# Patient Record
Sex: Female | Born: 1980
Health system: Southern US, Community
[De-identification: ages and names within clinical notes are randomized; demographics above are authoritative.]

## PROBLEM LIST (undated history)

## (undated) DIAGNOSIS — I10 Essential (primary) hypertension: Secondary | ICD-10-CM

## (undated) DIAGNOSIS — F329 Major depressive disorder, single episode, unspecified: Secondary | ICD-10-CM

## (undated) DIAGNOSIS — F32A Depression, unspecified: Secondary | ICD-10-CM

## (undated) DIAGNOSIS — R000 Tachycardia, unspecified: Secondary | ICD-10-CM

## (undated) HISTORY — DX: Depression, unspecified: F32.A

## (undated) HISTORY — DX: Essential (primary) hypertension: I10

## (undated) HISTORY — DX: Major depressive disorder, single episode, unspecified: F32.9

## (undated) HISTORY — DX: Tachycardia, unspecified: R00.0

---

## 2003-04-06 ENCOUNTER — Inpatient Hospital Stay (HOSPITAL_COMMUNITY): Admission: AD | Admit: 2003-04-06 | Discharge: 2003-04-12 | Payer: Self-pay | Admitting: Obstetrics & Gynecology

## 2003-04-07 ENCOUNTER — Encounter: Payer: Self-pay | Admitting: Obstetrics and Gynecology

## 2003-04-09 ENCOUNTER — Encounter: Payer: Self-pay | Admitting: Obstetrics and Gynecology

## 2003-04-18 ENCOUNTER — Ambulatory Visit (HOSPITAL_COMMUNITY): Admission: AD | Admit: 2003-04-18 | Discharge: 2003-04-18 | Payer: Self-pay | Admitting: Obstetrics & Gynecology

## 2003-04-20 ENCOUNTER — Ambulatory Visit (HOSPITAL_COMMUNITY): Admission: AD | Admit: 2003-04-20 | Discharge: 2003-04-20 | Payer: Self-pay | Admitting: Obstetrics & Gynecology

## 2003-04-24 ENCOUNTER — Ambulatory Visit (HOSPITAL_COMMUNITY): Admission: AD | Admit: 2003-04-24 | Discharge: 2003-04-24 | Payer: Self-pay | Admitting: Obstetrics & Gynecology

## 2003-04-27 ENCOUNTER — Ambulatory Visit (HOSPITAL_COMMUNITY): Admission: AD | Admit: 2003-04-27 | Discharge: 2003-04-27 | Payer: Self-pay | Admitting: Obstetrics & Gynecology

## 2003-05-01 ENCOUNTER — Ambulatory Visit (HOSPITAL_COMMUNITY): Admission: AD | Admit: 2003-05-01 | Discharge: 2003-05-01 | Payer: Self-pay | Admitting: Obstetrics & Gynecology

## 2003-05-04 ENCOUNTER — Ambulatory Visit (HOSPITAL_COMMUNITY): Admission: AD | Admit: 2003-05-04 | Discharge: 2003-05-04 | Payer: Self-pay | Admitting: Obstetrics & Gynecology

## 2003-05-08 ENCOUNTER — Inpatient Hospital Stay (HOSPITAL_COMMUNITY): Admission: AD | Admit: 2003-05-08 | Discharge: 2003-05-11 | Payer: Self-pay | Admitting: Obstetrics & Gynecology

## 2003-09-28 ENCOUNTER — Other Ambulatory Visit: Admission: RE | Admit: 2003-09-28 | Discharge: 2003-09-28 | Payer: Self-pay | Admitting: Advanced Practice Midwife

## 2005-09-25 ENCOUNTER — Ambulatory Visit: Payer: Self-pay | Admitting: Family Medicine

## 2006-02-02 ENCOUNTER — Ambulatory Visit: Payer: Self-pay | Admitting: Family Medicine

## 2006-11-29 ENCOUNTER — Ambulatory Visit: Payer: Self-pay | Admitting: Family Medicine

## 2006-11-29 DIAGNOSIS — I10 Essential (primary) hypertension: Secondary | ICD-10-CM

## 2006-11-29 DIAGNOSIS — J309 Allergic rhinitis, unspecified: Secondary | ICD-10-CM | POA: Insufficient documentation

## 2006-12-27 ENCOUNTER — Ambulatory Visit: Payer: Self-pay | Admitting: Family Medicine

## 2006-12-27 DIAGNOSIS — M79609 Pain in unspecified limb: Secondary | ICD-10-CM

## 2006-12-28 ENCOUNTER — Telehealth (INDEPENDENT_AMBULATORY_CARE_PROVIDER_SITE_OTHER): Payer: Self-pay | Admitting: Family Medicine

## 2006-12-31 ENCOUNTER — Encounter (INDEPENDENT_AMBULATORY_CARE_PROVIDER_SITE_OTHER): Payer: Self-pay | Admitting: Family Medicine

## 2007-01-03 ENCOUNTER — Ambulatory Visit (HOSPITAL_COMMUNITY): Admission: RE | Admit: 2007-01-03 | Discharge: 2007-01-03 | Payer: Self-pay | Admitting: Family Medicine

## 2007-01-03 LAB — CONVERTED CEMR LAB
BUN: 11 mg/dL (ref 6–23)
CO2: 23 meq/L (ref 19–32)
Chloride: 103 meq/L (ref 96–112)
Cholesterol: 195 mg/dL (ref 0–200)
Creatinine, Ser: 0.81 mg/dL (ref 0.40–1.50)
Eosinophils Absolute: 0.2 10*3/uL (ref 0.0–0.7)
Glucose, Bld: 78 mg/dL (ref 70–99)
HCT: 46.7 % (ref 39.0–52.0)
HDL: 49 mg/dL (ref >39)
LDL Cholesterol: 126 mg/dL — ABNORMAL HIGH (ref 0–99)
Lymphocytes Relative: 25 % (ref 12–46)
Lymphs Abs: 1.2 10*3/uL (ref 0.7–3.3)
MCV: 85.1 fL (ref 78.0–100.0)
Monocytes Relative: 9 % (ref 3–11)
Neutro Abs: 2.8 10*3/uL (ref 1.7–7.7)
Neutrophils Relative %: 60 % (ref 43–77)
Potassium: 4 meq/L (ref 3.5–5.3)
Sodium: 140 meq/L (ref 135–145)
Total Bilirubin: 0.7 mg/dL (ref 0.3–1.2)
Total CHOL/HDL Ratio: 4
Triglycerides: 102 mg/dL (ref <150)
VLDL: 20 mg/dL (ref 0–40)
WBC: 4.7 10*3/uL (ref 4.0–10.5)

## 2007-01-10 ENCOUNTER — Ambulatory Visit: Payer: Self-pay | Admitting: Family Medicine

## 2007-01-10 LAB — CONVERTED CEMR LAB: HDL goal, serum: 40 mg/dL

## 2007-02-25 ENCOUNTER — Ambulatory Visit: Payer: Self-pay | Admitting: Family Medicine

## 2007-02-25 DIAGNOSIS — F438 Other reactions to severe stress: Secondary | ICD-10-CM

## 2007-02-26 ENCOUNTER — Encounter (INDEPENDENT_AMBULATORY_CARE_PROVIDER_SITE_OTHER): Payer: Self-pay | Admitting: Family Medicine

## 2007-03-01 LAB — CONVERTED CEMR LAB
BUN: 10 mg/dL (ref 6–23)
CO2: 23 meq/L (ref 19–32)
Glucose, Bld: 81 mg/dL (ref 70–99)

## 2007-03-10 ENCOUNTER — Encounter (INDEPENDENT_AMBULATORY_CARE_PROVIDER_SITE_OTHER): Payer: Self-pay | Admitting: Family Medicine

## 2007-04-27 ENCOUNTER — Ambulatory Visit: Payer: Self-pay | Admitting: Family Medicine

## 2007-04-27 ENCOUNTER — Encounter (INDEPENDENT_AMBULATORY_CARE_PROVIDER_SITE_OTHER): Payer: Self-pay | Admitting: Family Medicine

## 2007-04-27 ENCOUNTER — Encounter: Payer: Self-pay | Admitting: Family Medicine

## 2007-04-27 LAB — CONVERTED CEMR LAB: Pap Smear: NORMAL

## 2007-04-28 ENCOUNTER — Encounter (INDEPENDENT_AMBULATORY_CARE_PROVIDER_SITE_OTHER): Payer: Self-pay | Admitting: Family Medicine

## 2007-07-29 ENCOUNTER — Ambulatory Visit: Payer: Self-pay | Admitting: Family Medicine

## 2007-07-29 DIAGNOSIS — G5600 Carpal tunnel syndrome, unspecified upper limb: Secondary | ICD-10-CM

## 2007-07-30 ENCOUNTER — Encounter (INDEPENDENT_AMBULATORY_CARE_PROVIDER_SITE_OTHER): Payer: Self-pay | Admitting: Family Medicine

## 2007-07-30 LAB — CONVERTED CEMR LAB
CO2: 23 meq/L (ref 19–32)
Calcium: 9.3 mg/dL (ref 8.4–10.5)
Creatinine, Ser: 0.73 mg/dL (ref 0.40–1.20)
Glucose, Bld: 96 mg/dL (ref 70–99)
Sodium: 138 meq/L (ref 135–145)

## 2007-08-01 ENCOUNTER — Telehealth (INDEPENDENT_AMBULATORY_CARE_PROVIDER_SITE_OTHER): Payer: Self-pay | Admitting: *Deleted

## 2007-08-17 ENCOUNTER — Encounter (INDEPENDENT_AMBULATORY_CARE_PROVIDER_SITE_OTHER): Payer: Self-pay | Admitting: Family Medicine

## 2007-09-30 ENCOUNTER — Ambulatory Visit: Payer: Self-pay | Admitting: Family Medicine

## 2008-05-31 ENCOUNTER — Inpatient Hospital Stay (HOSPITAL_COMMUNITY): Admission: RE | Admit: 2008-05-31 | Discharge: 2008-06-02 | Payer: Self-pay | Admitting: Obstetrics and Gynecology

## 2008-07-09 IMAGING — US US ABDOMEN COMPLETE
1 series · 14 of 25 positions shown · non-contrast
Comparison: none

CLINICAL DATA: Epigastric pain.
 ABDOMEN ULTRASOUND:
TECHNIQUE: Complete abdominal ultrasound examination was performed including evaluation of the liver, gallbladder, bile ducts, pancreas, kidneys, spleen, IVC, and abdominal aorta.
 No comparison.

[Series 1: unknown · 0.33mm/px · 14 of 55 slices shown]
[im 1/55]
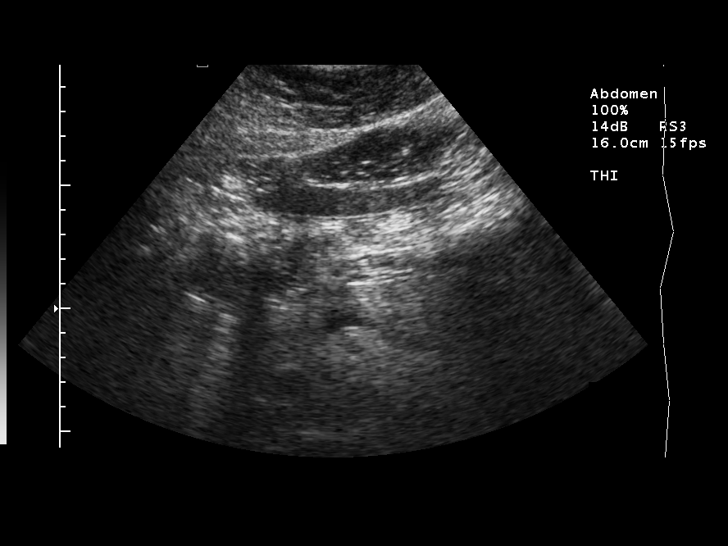
[im 5/55]
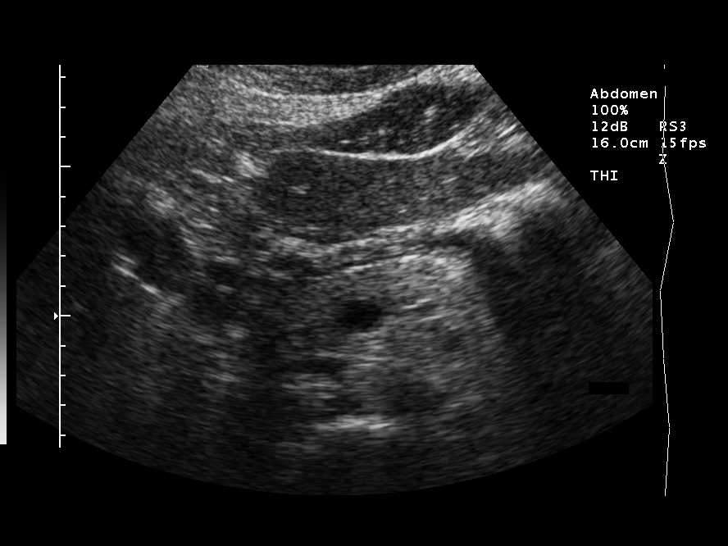
[im 10/55]
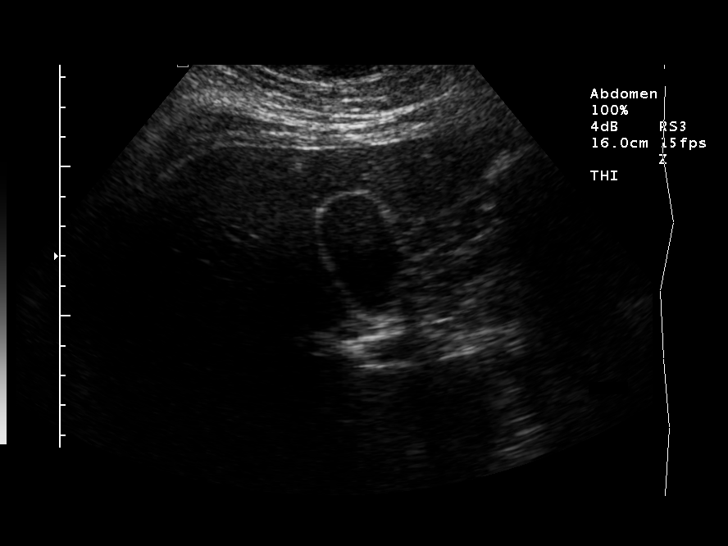
[im 14/55]
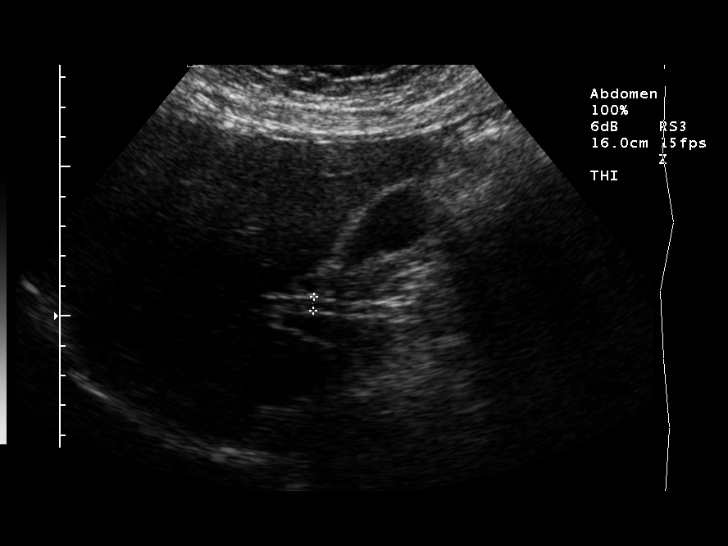
[im 19/55]
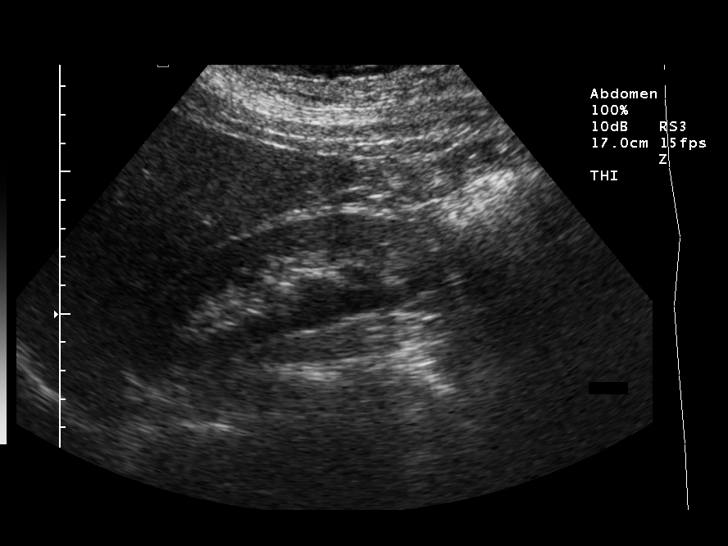
[im 21/55]
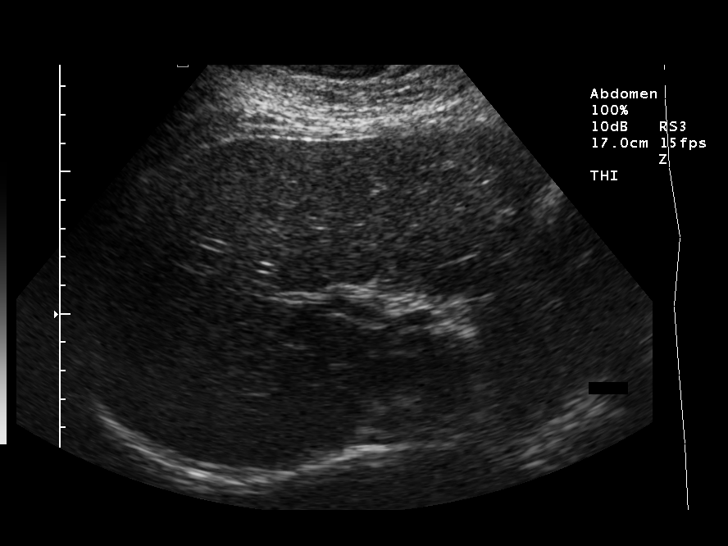
[im 25/55]
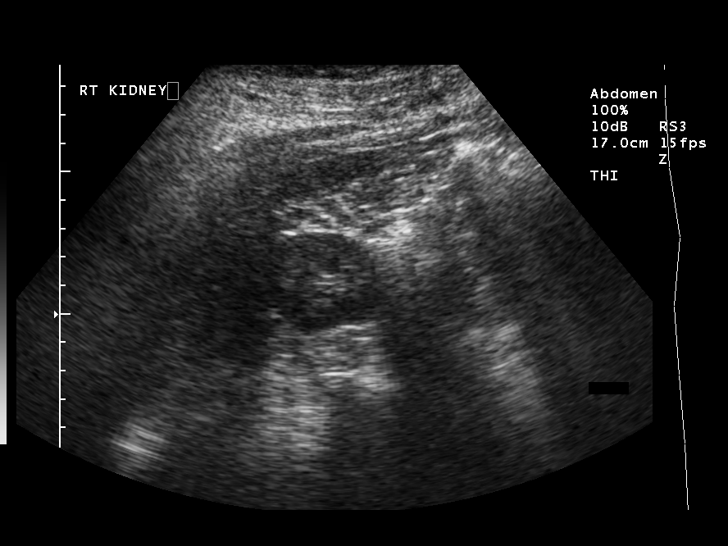
[im 30/55]
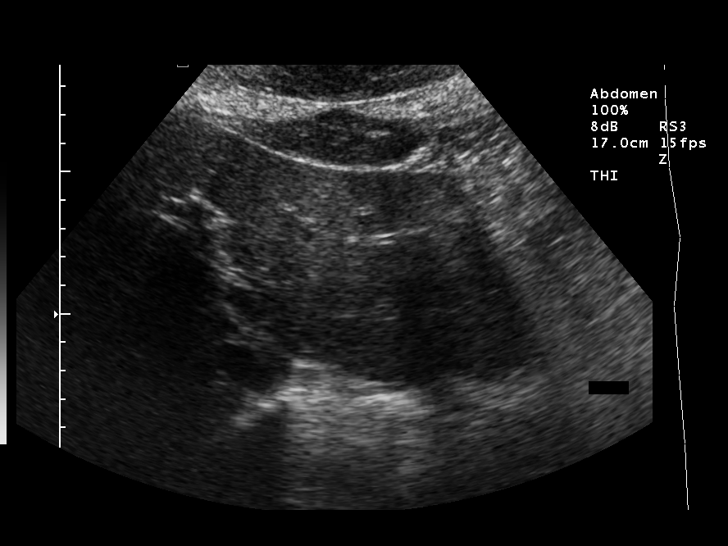
[im 34/55]
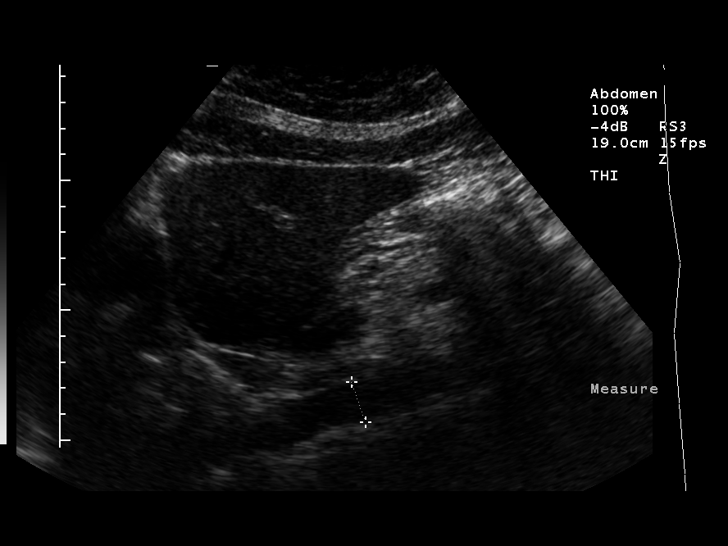
[im 37/55]
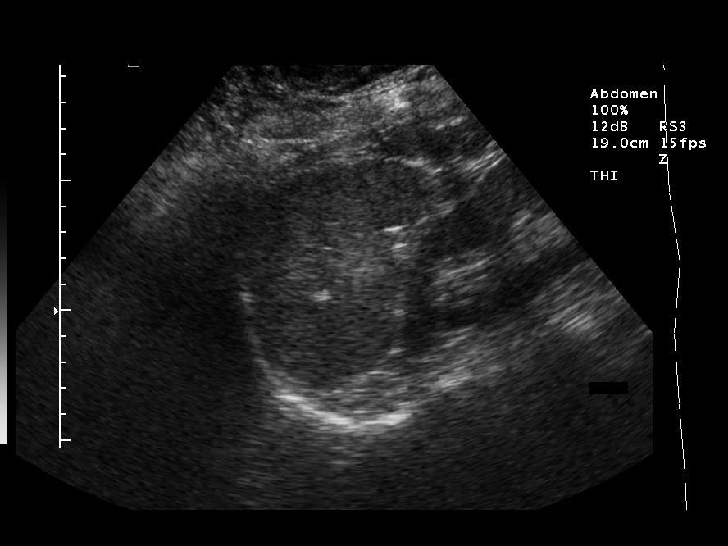
[im 41/55]
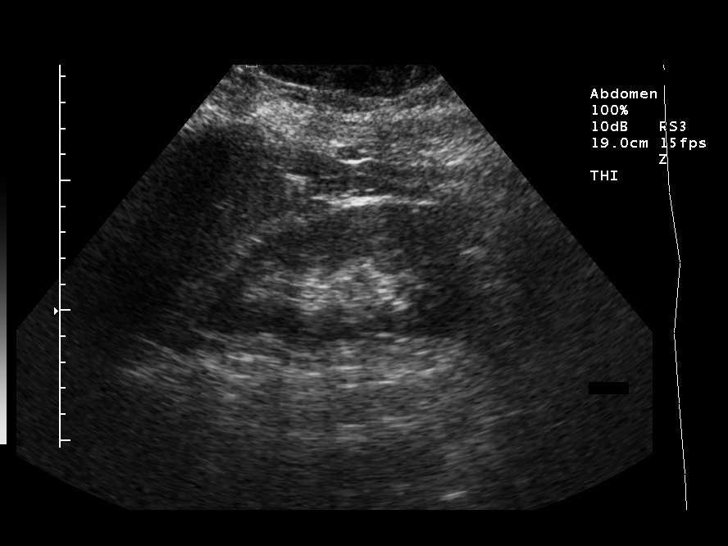
[im 46/55]
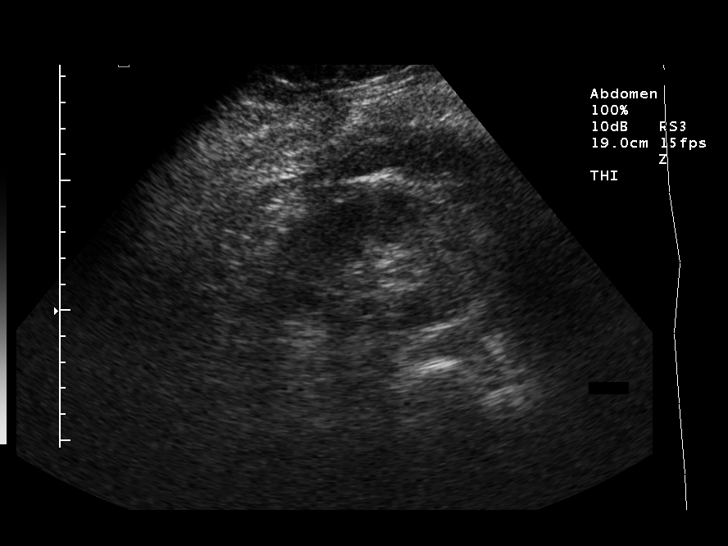
[im 50/55]
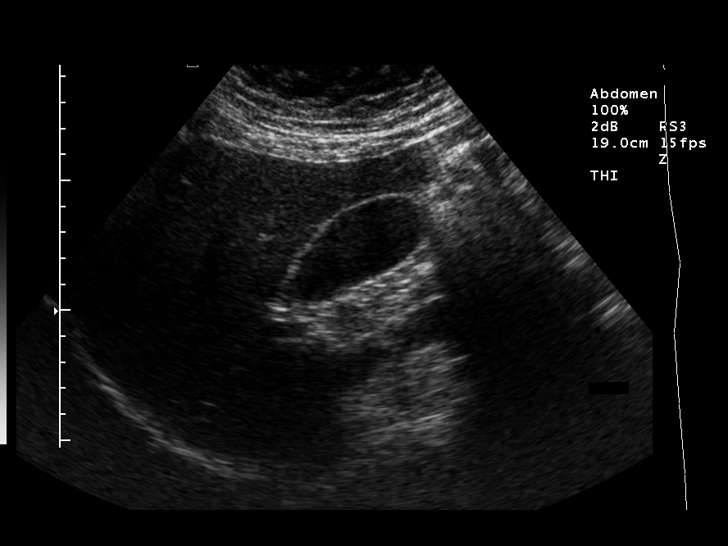
[im 55/55]
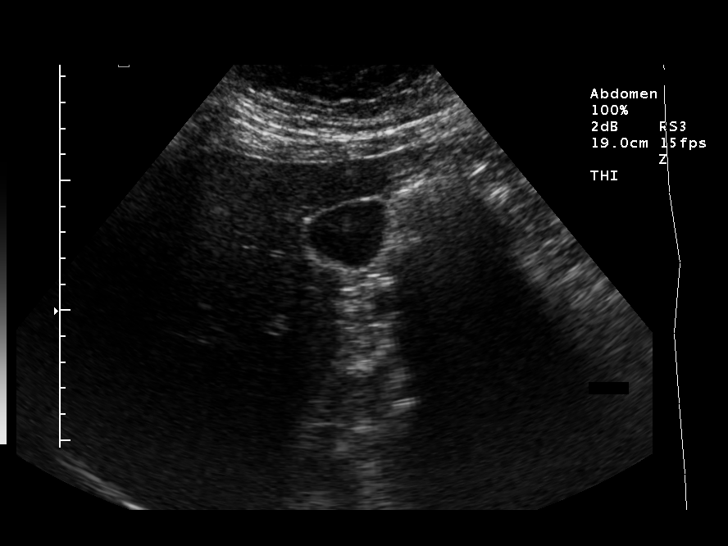

[14 of 25 positions shown; findings below may reference images not displayed]

FINDINGS: An echogenic focus in the nondependent wall of the gallbladder is present which does not shadow.  It may be a polyp but does not appear to be a stone.  The gallbladder wall is not thickened measuring 2.2 mm. The common bile duct is 4.7 mm.  The liver, IVC, pancreas, spleen, kidneys, and aorta are normal.
IMPRESSION: Echogenic focus in the nondependent wall of the gallbladder does not appear to represent a stone and may be a small polyp.  No acute abnormality.

## 2011-01-20 NOTE — Discharge Summary (Signed)
NAME:  HOPPERHarold, Mattes                ACCOUNT NO.:  [id number]   MEDICAL RECORD NO.:  [id number]          PATIENT TYPE:  INP   LOCATION:  9111                          FACILITY:  [facility]   PHYSICIAN:  Gerrit Friends. Aldona Bar, M.D.   DATE OF BIRTH:  08-Jul-1981   DATE OF ADMISSION:  05/31/2008  DATE OF DISCHARGE:  06/02/2008                               DISCHARGE SUMMARY   DISCHARGE DIAGNOSES:  1. A 41-week intrauterine pregnancy, delivered 7 pounds 15 ounces      female infant, Apgars 9 and 9.  2. Blood type O+.  3. Induction of labor.   PROCEDURES:  Normal spontaneous delivery, first-degree tears of repair.   SUMMARY:  This 30 year old gravida 2 now para 2 was admitted to 41 weeks  plus for induction.  She had an uneventful course of her labor with  subsequent normal spontaneous delivery of a viable 7 pounds 15 ounces  female infant with Apgars of 9 and 9 and first-degree tears were repair  without difficulty.  Her postpartum hemoglobin was 9.8 with a white  count of 95,000, and a platelet count of 219,000.  On the morning of  September 26, she was ambulating well, tolerating a regular diet well,  was bottle feeding without difficulty.   PHYSICAL EXAMINATION:  VITAL SIGNS:  Stable.  She was desirous of  discharge.  Accordingly, she was given all appropriate instructions and  understood all instructions well.   DISCHARGE MEDICATIONS:  1. Feosol capsules - 1 daily, until she returns to the office in 4      weeks' time.  2. Tylox 1-2 in 4-6 hours as needed for severe discomfort.  3. Motrin 600 mg every 6 hours as needed for cramping or pain.   As mentioned, she was given written discharge instructions and  understood them well.   CONDITION ON DISCHARGE:  Improved.      Gerrit Friends. Aldona Bar, M.D.  Electronically Signed     RMW/MEDQ  D:  06/02/2008  T:  06/02/2008  Job:  [id number]

## 2011-01-23 NOTE — Op Note (Signed)
   NAME:  BRINLYN, CENA                          ACCOUNT NO.:  [id number]   MEDICAL RECORD NO.:  [id number]                   PATIENT TYPE:  INP   LOCATION:  LDR2                                 FACILITY:  [facility]   PHYSICIAN:  Lazaro Arms, M.D.                DATE OF BIRTH:  1980/09/18   DATE OF PROCEDURE:  05/09/2003  DATE OF DISCHARGE:                                  PROCEDURE NOTE   PROCEDURE:  Epidural placement.   Asher Muir is a 30 year old white female, gravida 1, para 0, being induced at 39  weeks because of a history of a marginal sinus abruption.  She has  progressed nicely through labor.  She is 6 cm, had two doses of IV pain  medicine, and is requesting an epidural.  The fetal heart rate strip is  reassuring.   The patient is placed in sitting position, the iliac crest, the L2-3  interspace identified.  The area is prepped and draped, 1% injected as a  local anesthetic.  A 17-gauge Tuohy needle is used and a loss of resistance  technique employed with one pass.  The epidural space is found, 10 mL of  0.125% bupivacaine is placed as a test dose without ill effects.  Additional  dosing was performed, 0.125% bupivacaine and 5 mL of 1.5% lidocaine with  1:200,000 epinephrine.  The continuous pump was then hooked up.  It was  placed 5 cm into the epidural space and taped at 12 cm at the skin.  The  patient was comfortable several minutes after it was placed with no ill  effects.                                               Lazaro Arms, M.D.    Loraine Maple  D:  05/09/2003  T:  05/09/2003  Job:  [id number]

## 2011-01-23 NOTE — H&P (Signed)
NAME:  Jacqueline Lowery, Jacqueline Lowery                        ACCOUNT NO.:  [id number]   MEDICAL RECORD NO.:  [id number]                   PATIENT TYPE:  INP   LOCATION:  LDR2                                 FACILITY:  [facility]   PHYSICIAN:  Tilda Burrow, M.D.              DATE OF BIRTH:  19-Feb-1981   DATE OF ADMISSION:  04/06/2003  DATE OF DISCHARGE:                                HISTORY & PHYSICAL   ADMITTING DIAGNOSES:  1. Heavy vaginal bleeding.  2. Pregnancy 34 weeks 2 days to 35 weeks 3 days.  3. Anterior marginal placenta.  4. Marginal sinus separation with bleeding.   HISTORY OF PRESENT ILLNESS:  This is a 30 year old female, gravida 1, para  0, limited prenatal care, last menstrual period August 07, 2002, placing  menstrual [facility] May 17, 2003.  This would make her 34 weeks 2 days.  Ultrasound May 09, 2003, suggested that she is now 35 weeks 3 days  (plus/minus three weeks).  She is admitted with spontaneous heavy bleeding  beginning at 6 p.m. at home, one hour after mild discomfort was first noted.  She has had no rupture of membranes.  EMS transported to [facility]  and shows generous vaginal bleeding and external monitoring shows healthy  baby with reactive fetal heart rate tracing but shows mild uterine  contractions on a regular basis spaced out at a 3- to 4-minute interval,  with good relaxation between contractions.  Ultrasound shows anterior  placenta close to the os but not completely reaching the os with the  placenta still ballotable and engaged with the lower uterine segment.  There  is no retroplacental bleeding noted in the anterior placenta.  The brief  ultrasound done by __________.  Limited ultrasound was performed.   PAST MEDICAL HISTORY:  Benign.   SURGICAL HISTORY:  Negative.   ALLERGIES:  None.   PHYSICAL EXAMINATION:  GENERAL:  Healthy-appearing and severely anemic  Caucasian female.  The prenatal record shows that she was anemic with  hemoglobin 8.5, hematocrit 29.5 with her initial visit one month ago.  ABDOMEN:  Nontender.  Gravid uterus, 33 cm at last check, with ultrasound  showing vertex presentation with anterior placenta below the vertex.  GENITOURINARY:  Cervical exam after completion of ultrasound shows clots in  the os which is less than 1 cm dilated and 50% effaced.  Ultrasound done  during the exam shows the uterus approximately a couple of centimeters from  the internal os to the cervical edge.   IMPRESSION:  Pregnancy at 34+ weeks gestation, anterior low-lying placenta  with marginal sinus separation.    PLAN:  Admit.  Tocolytics to reduce uterine irritability.  Antibiotics until  group B strep has returned.  Will keep Foley catheter in place.  Transfusion  of two units of packed cells given due to low hematocrit which returned at  7.7 and 24.8 after 2 L of fluids vigorously  administered.   ADDENDUM:  The patient's posttransfusion hemoglobin is 8.6, hematocrit 27.3  on April 07, 2003.                                               Tilda Burrow, M.D.    JVF/MEDQ  D:  04/08/2003  T:  04/08/2003  Job:  [id number]

## 2011-01-23 NOTE — Op Note (Signed)
NAME:  Jacqueline Lowery, Jacqueline Lowery                          ACCOUNT NO.:  [id number]   MEDICAL RECORD NO.:  [id number]                   PATIENT TYPE:  INP   LOCATION:  LDR2                                 FACILITY:  [facility]   PHYSICIAN:  Lazaro Arms, M.D.                DATE OF BIRTH:  Jun 13, 1981   DATE OF PROCEDURE:  05/09/2003  DATE OF DISCHARGE:                                  PROCEDURE NOTE   DELIVERY SUMMARY:  Onset of labor May 09, 2003, delivery May 09, 2003 at 1522.   Length of first stage labor 5 hours, 30 minutes.  Length of second stage  labor 2 hours, 22 minutes. Length of third stage labor 18 minutes.   DELIVERY NOTE:  Song had a vacuum assisted delivery under epidural  anesthesia due to variable decelerations that were prolonged.  The patient  was assisted x2 uterine contractions with a Kiwi vacuum pumped to the green  marker on the vacuum cup and upon delivery of head, vacuum was released. A  tight nuchal cord was noted x2, with inability to untangle cord. Cord was  clamped, cut and unwrapped with spontaneous delivery of shoulders and rest  of body at that time.  Upon delivery, infant had a good try, good tone, good  movement but very pale in color.  Apgars were 9 and 9.  Third stage of labor  was actually managed with 20 units of Pitocin, 1000 cc of D-4 LR to rapid  rate.   Upon inspection, there was a second degree perineal laceration which was  repaired under epidural and local anesthesia with 1% lidocaine 20 cc, 2-0  Vicryl, 3 interrupted stitches and subcuticular closed with good hemostasis  used.  Placenta was delivered spontaneously via Shoultice mechanism. A 3-  vessel cord was noted. Cord insertion was over to the edge of the cord.  Also, there was some scarring there from a previous placenta bleed.  Placenta was sent to pathology. Hemabate, 1/2 amp IM for uterine atony.  Estimated blood loss was approximately 400-450 cc. Epidural catheter was  removed  with blue tip intact.   Mother and infant both stabilized and transferred out to the postpartum unit  in good condition.  Dr. Milford Cage was notified of the fact that the baby  had a  tight nuchal cord x2 and a very pale color at the present time. Orders were  given.     Zerita Boers, N.M.                      Lazaro Arms, M.D.    DL/MEDQ  D:  95/63/[id number]  T:  05/09/2003  Job:  [id number]   cc:   Francoise Schaumann. Milford Cage, D.O.  [address]  [place]  [place] [address]  Fax: [phone]   Tilda Burrow, M.D.  [address]  [place]  [place] [address]  Fax: [phone]

## 2011-01-23 NOTE — Discharge Summary (Signed)
NAME:  Jacqueline Lowery, Jacqueline Lowery                        ACCOUNT NO.:  [id number]   MEDICAL RECORD NO.:  [id number]                   PATIENT TYPE:  INP   LOCATION:  A420                                 FACILITY:  [facility]   PHYSICIAN:  Tilda Burrow, M.D.              DATE OF BIRTH:  1981/07/28   DATE OF ADMISSION:  04/06/2003  DATE OF DISCHARGE:  04/12/2003                                 DISCHARGE SUMMARY   ADMITTING DIAGNOSES:  1. Pregnancy 34 weeks 2 days to 35 weeks 3 days, not delivered.  2. Heavy vaginal bleeding secondary to anterior marginal placenta, marginal-     sized separation.   DISCHARGE DIAGNOSES:  1. Pregnancy 35 weeks, not delivered.  2. Anterior marginal placenta.  3. Resolved heavy vaginal bleeding.   PROCEDURE:  April 06, 2003, OB ultrasound, limited, J. Benancio Deeds.   DISCHARGE MEDICATIONS:  None.   HOSPITAL COURSE:  This is a 30 year old female, gravida 1, para 0, limited  prenatal care about less than one month, admitted for heavy vaginal bleeding  beginning spontaneously one hour prior to admission.  EMS transport to [facility] showed generous vaginal bleeding.  External monitoring showed  a healthy baby.  Limited ultrasound by Jannifer Franklin showed an anterior  placenta without retroplacental bleeding.  The placenta was down close to  the cervical os, and the presenting part was ballottable.   The patient was admitted, given tocolytics to reduce irritability,  antibiotic therapy for 24 hours due to uncertain group B strep status.  Maternal blood type was confirmed as Rh positive, and a cross match of blood  was set up.  The patient had admitting hemoglobin of 7.7, hematocrit 24.8.  She was receiving IV fluids in the other arm at the time of this phlebotomy.   Close observation overnight was followed by an ultrasound in Department of  Radiology, which did not show any previa.  Interestingly, it showed there  was a loop of cord below the ballottable  presenting part.  This raised  concerns for the need of prolonged hospitalization.  Fortunately, the  patient remained stable with no bleeding for 48 hours, and repeat ultrasound  was performed showing resolution of the funic presentation.  The patient was  allowed to ambulate on April 10, 2003, and did well.  Brethine was stopped  on April 11, 2003.  The patient then was stable  after being ambulatory for a day without any bleeding or complications and  was sent home with limited activity on April 12, 2003, for follow-up in my  office in one week.   ADDENDUM:  The patient's hematocrit will need to be rechecked at follow-up  visit.  Tilda Burrow, M.D.    JVF/MEDQ  D:  04/23/2003  T:  04/24/2003  Job:  [id number]

## 2011-06-08 LAB — CBC
MCHC: 32.2
MCV: 73.8 — ABNORMAL LOW
Platelets: 219
Platelets: 255
RDW: 16.2 — ABNORMAL HIGH
WBC: 8.8

## 2011-06-08 LAB — RPR: RPR Ser Ql: NONREACTIVE

## 2013-02-06 ENCOUNTER — Ambulatory Visit (INDEPENDENT_AMBULATORY_CARE_PROVIDER_SITE_OTHER): Payer: PRIVATE HEALTH INSURANCE | Admitting: Family Medicine

## 2013-02-06 ENCOUNTER — Telehealth: Payer: Self-pay | Admitting: Family Medicine

## 2013-02-06 ENCOUNTER — Encounter: Payer: Self-pay | Admitting: Family Medicine

## 2013-02-06 VITALS — BP 128/82 | HR 96 | Temp 97.4°F | Wt 249.0 lb

## 2013-02-06 DIAGNOSIS — J309 Allergic rhinitis, unspecified: Secondary | ICD-10-CM

## 2013-02-06 DIAGNOSIS — J302 Other seasonal allergic rhinitis: Secondary | ICD-10-CM | POA: Insufficient documentation

## 2013-02-06 DIAGNOSIS — J019 Acute sinusitis, unspecified: Secondary | ICD-10-CM | POA: Insufficient documentation

## 2013-02-06 MED ORDER — FLUTICASONE PROPIONATE 50 MCG/ACT NA SUSP: 2.0000 | Freq: Every day | NASAL | Status: DC

## 2013-02-06 MED ORDER — SERTRALINE HCL 50 MG PO TABS: 50.0000 mg | ORAL_TABLET | Freq: Every day | ORAL | Status: DC

## 2013-02-06 MED ORDER — AZITHROMYCIN 250 MG PO TABS: ORAL_TABLET | ORAL | Status: DC

## 2013-02-06 NOTE — Progress Notes (Signed)
Patient ID: Jacqueline Lowery, female   DOB: Dec 28, 1980, 32 y.o.   MRN: [id number] SUBJECTIVE: HPI: Congested for:   1 month    has had runny and itchy eyes. has had runny, itchy and stuffy nose. has had Sneezing as well. has had Coughing has not had Wheezing  Medications used for this problem:zyrtec  has not had effective response.   PMH/PSH: reviewed/updated in Epic  SH/FH: reviewed/updated in Epic  Allergies: reviewed/updated in Epic  Medications: reviewed/updated in Epic  Immunizations: reviewed/updated in Epic  ROS: As above in the HPI. All other systems are stable or negative.  OBJECTIVE: APPEARANCE:  Patient in no acute distress.The patient appeared well nourished and normally developed. Acyanotic. Waist: VITAL SIGNS:BP 128/82  Pulse 96  Temp(Src) 97.4 F (36.3 C) (Oral)  Wt 249 lb (112.946 kg)  BMI 41.44 kg/m2  LMP 02/06/2013 Obese WF  SKIN: warm and  Dry without overt rashes, tattoos and scars  HEAD and Neck: without JVD, Head and scalp: normal Eyes:No scleral icterus. Fundi normal, eye movements normal.lower eyelids puffy, racoon eyes. Ears: Auricle normal, canal normal, Tympanic membranes normal, insufflation normal. Nose: rhinitis, boggy swollen mucosa. Throat: normal Neck & thyroid: normal Maxillary sinuses tender.  CHEST & LUNGS: Chest wall: normal Lungs: Clear  CVS: Reveals the PMI to be normally located. Regular rhythm, First and Second Heart sounds are normal,  absence of murmurs, rubs or gallops. Peripheral vasculature: Radial pulses: normal Dorsal pedis pulses: normal Posterior pulses: normal  ABDOMEN:  Appearance: normal Benign,, no organomegaly, no masses, no Abdominal Aortic enlargement. No Guarding , no rebound. No Bruits. Bowel sounds: normal  RECTAL: N/A GU: N/A  EXTREMETIES: nonedematous.   MUSCULOSKELETAL:  Spine: normal Joints: intact  NEUROLOGIC: oriented to time,place and person;  nonfocal.  ASSESSMENT: Seasonal allergic rhinitis - Plan: fluticasone (FLONASE) 50 MCG/ACT nasal spray  Sinusitis, acute - Plan: azithromycin (ZITHROMAX Z-PAK) 250 MG tablet  PLAN: No orders of the defined types were placed in this encounter.   Meds ordered this encounter  Medications  . propranolol (INDERAL) 10 MG tablet    Sig: Take 10 mg by mouth 3 (three) times daily as needed.  Marland Kitchen DISCONTD: sertraline (ZOLOFT) 50 MG tablet    Sig: Take 50 mg by mouth daily.  . fluticasone (FLONASE) 50 MCG/ACT nasal spray    Sig: Place 2 sprays into the nose daily.    Dispense:  16 g    Refill:  6  . azithromycin (ZITHROMAX Z-PAK) 250 MG tablet    Sig: 2 tabs on day1 1 tab daily from day 2 to 5    Dispense:  6 each    Refill:  0  . sertraline (ZOLOFT) 50 MG tablet    Sig: Take 1 tablet (50 mg total) by mouth daily.    Dispense:  30 tablet    Refill:  3  continue with the zyrtec.  Return if symptoms worsen or fail to improve.  [name] P. Modesto Charon, M.D.

## 2013-02-06 NOTE — Telephone Encounter (Signed)
Sinus congestion and headache off and on x 1 month.  Appt scheduled this afternoon with Dr. Modesto Charon.

## 2013-04-11 ENCOUNTER — Encounter: Payer: Self-pay | Admitting: Nurse Practitioner

## 2013-04-11 ENCOUNTER — Ambulatory Visit (INDEPENDENT_AMBULATORY_CARE_PROVIDER_SITE_OTHER): Payer: PRIVATE HEALTH INSURANCE | Admitting: Nurse Practitioner

## 2013-04-11 VITALS — BP 110/75 | HR 85 | Temp 97.9°F | Ht 64.0 in | Wt 243.0 lb

## 2013-04-11 DIAGNOSIS — R200 Anesthesia of skin: Secondary | ICD-10-CM

## 2013-04-11 DIAGNOSIS — R000 Tachycardia, unspecified: Secondary | ICD-10-CM

## 2013-04-11 DIAGNOSIS — I10 Essential (primary) hypertension: Secondary | ICD-10-CM

## 2013-04-11 DIAGNOSIS — R002 Palpitations: Secondary | ICD-10-CM

## 2013-04-11 DIAGNOSIS — R209 Unspecified disturbances of skin sensation: Secondary | ICD-10-CM

## 2013-04-11 NOTE — Patient Instructions (Addendum)
Anxiety and Panic Attacks Your caregiver has informed you that you are having an anxiety or panic attack. There may be many forms of this. Most of the time these attacks come suddenly and without warning. They come at any time of day, including periods of sleep, and at any time of life. They may be strong and unexplained. Although panic attacks are very scary, they are physically harmless. Sometimes the cause of your anxiety is not known. Anxiety is a protective mechanism of the body in its fight or flight mechanism. Most of these perceived danger situations are actually nonphysical situations (such as anxiety over losing a job). CAUSES  The causes of an anxiety or panic attack are many. Panic attacks may occur in otherwise healthy people given a certain set of circumstances. There may be a genetic cause for panic attacks. Some medications may also have anxiety as a side effect. SYMPTOMS  Some of the most common feelings are:  Intense terror.  Dizziness, feeling faint.  Hot and cold flashes.  Fear of going crazy.  Feelings that nothing is real.  Sweating.  Shaking.  Chest pain or a fast heartbeat (palpitations).  Smothering, choking sensations.  Feelings of impending doom and that death is near.  Tingling of extremities, this may be from over-breathing.  Altered reality (derealization).  Being detached from yourself (depersonalization). Several symptoms can be present to make up anxiety or panic attacks. DIAGNOSIS  The evaluation by your caregiver will depend on the type of symptoms you are experiencing. The diagnosis of anxiety or panic attack is made when no physical illness can be determined to be a cause of the symptoms. TREATMENT  Treatment to prevent anxiety and panic attacks may include:  Avoidance of circumstances that cause anxiety.  Reassurance and relaxation.  Regular exercise.  Relaxation therapies, such as yoga.  Psychotherapy with a psychiatrist or  therapist.  Avoidance of caffeine, alcohol and illegal drugs.  Prescribed medication. SEEK IMMEDIATE MEDICAL CARE IF:   You experience panic attack symptoms that are different than your usual symptoms.  You have any worsening or concerning symptoms. Document Released: 08/24/2005 Document Revised: 11/16/2011 Document Reviewed: 12/26/2009 [facility] Patient Information 2014 [place], [place]. Palpitations  A palpitation is the feeling that your heartbeat is irregular or is faster than normal. It may feel like your heart is fluttering or skipping a beat. Palpitations are usually not a serious problem. However, in some cases, you may need further medical evaluation. CAUSES  Palpitations can be caused by:  Smoking.  Caffeine or other stimulants, such as diet pills or energy drinks.  Alcohol.  Stress and anxiety.  Strenuous physical activity.  Fatigue.  Certain medicines.  Heart disease, especially if you have a history of arrhythmias. This includes atrial fibrillation, atrial flutter, or supraventricular tachycardia.  An improperly working pacemaker or defibrillator. DIAGNOSIS  To find the cause of your palpitations, your caregiver will take your history and perform a physical exam. Tests may also be done, including:  Electrocardiography (ECG). This test records the heart's electrical activity.  Cardiac monitoring. This allows your caregiver to monitor your heart rate and rhythm in real time.  Holter monitor. This is a portable device that records your heartbeat and can help diagnose heart arrhythmias. It allows your caregiver to track your heart activity for several days, if needed.  Stress tests by exercise or by giving medicine that makes the heart beat faster. TREATMENT  Treatment of palpitations depends on the cause of your symptoms and can vary greatly.  Most cases of palpitations do not require any treatment other than time, relaxation, and monitoring your symptoms. Other  causes, such as atrial fibrillation, atrial flutter, or supraventricular tachycardia, usually require further treatment. HOME CARE INSTRUCTIONS   Avoid:  Caffeinated coffee, tea, soft drinks, diet pills, and energy drinks.  Chocolate.  Alcohol.  Stop smoking if you smoke.  Reduce your stress and anxiety. Things that can help you relax include:  A method that measures bodily functions so you can learn to control them (biofeedback).  Yoga.  Meditation.  Physical activity such as swimming, jogging, or walking.  Get plenty of rest and sleep. SEEK MEDICAL CARE IF:   You continue to have a fast or irregular heartbeat beyond 24 hours.  Your palpitations occur more often. SEEK IMMEDIATE MEDICAL CARE IF:  You develop chest pain or shortness of breath.  You have a severe headache.  You feel dizzy, or you faint. MAKE SURE YOU:  Understand these instructions.  Will watch your condition.  Will get help right away if you are not doing well or get worse. Document Released: 08/21/2000 Document Revised: 02/23/2012 Document Reviewed: 10/23/2011 [facility] Patient Information 2014 [place], [place].

## 2013-04-11 NOTE — Progress Notes (Signed)
  Subjective:    Patient ID: Jacqueline Lowery, female    DOB: 09-19-1980, 32 y.o.   MRN: [id number] Patient here for CPE no PAP Anxiety Presents for follow-up visit. Symptoms include excessive worry, nervous/anxious behavior and palpitations. Patient reports no chest pain, depressed mood, insomnia, irritability or panic. Symptoms occur occasionally. The severity of symptoms is mild. The quality of sleep is fair. Nighttime awakenings: one to two.     Pt states she her "heart races" and she was put on Inderal for that. Pt states her heart rate is in the 110 when she does not take her medication. Pt denies ever having EKG  C/O bil hand numbness intermittently- using her phone or driving makes them worse   Review of Systems  Constitutional: Negative for irritability.  Cardiovascular: Positive for palpitations. Negative for chest pain.  Psychiatric/Behavioral: The patient is nervous/anxious. The patient does not have insomnia.   All other systems reviewed and are negative.       Objective:   Physical Exam  Vitals reviewed. Constitutional: She is oriented to person, place, and time. She appears well-developed and well-nourished.  HENT:  Head: Normocephalic.  Right Ear: External ear normal.  Left Ear: External ear normal.  Nose: Nose normal.  Mouth/Throat: Oropharynx is clear and moist.  Eyes: EOM are normal. Pupils are equal, round, and reactive to light.  Neck: Trachea normal, normal range of motion and full passive range of motion without pain. Neck supple. No JVD present. Carotid bruit is not present. No thyromegaly present.  Cardiovascular: Normal rate, regular rhythm, normal heart sounds and intact distal pulses.  Exam reveals no gallop and no friction rub.   No murmur heard. Pulmonary/Chest: Effort normal and breath sounds normal.  Abdominal: Soft. Bowel sounds are normal. She exhibits no distension and no mass. There is no tenderness.  Musculoskeletal: Normal range of motion. She  exhibits no edema and no tenderness.  Lymphadenopathy:    She has no cervical adenopathy.  Neurological: She is alert and oriented to person, place, and time. She has normal reflexes.  Negative phalen bil Negative tinel bil  Skin: Skin is warm and dry.  Psychiatric: She has a normal mood and affect. Her behavior is normal. Judgment and thought content normal.     BP 110/75  Pulse 85  Temp(Src) 97.9 F (36.6 C) (Oral)  Ht 5\' 4"  (1.626 m)  Wt 243 lb (110.224 kg)  BMI 41.69 kg/m2  LMP 04/04/2013      Assessment & Plan:  1. Palpitations Avoid caffeine Continue inderal as ordered - EKG 12-Lead  2. Hypertension Low NA+ diet  3. Tachycardia Avoid caffeine - CMP14+EGFR - NMR, lipoprofile  4. Hand numbness Watch Wear wrist braces at night If worsens will do rtho referral  Mary-Margaret Daphine Deutscher, FNP

## 2013-04-12 LAB — CMP14+EGFR
AST: 23 IU/L (ref 0–40)
Albumin: 3.9 g/dL (ref 3.5–5.5)
BUN: 10 mg/dL (ref 6–20)
CO2: 25 mmol/L (ref 18–29)
Calcium: 9.7 mg/dL (ref 8.7–10.2)
Creatinine, Ser: 0.64 mg/dL (ref 0.57–1.00)
Globulin, Total: 3.1 g/dL (ref 1.5–4.5)

## 2013-04-12 LAB — NMR, LIPOPROFILE
Cholesterol: 199 mg/dL (ref <200)
HDL Cholesterol by NMR: 51 mg/dL (ref >=40)
HDL Particle Number: 38.7 umol/L (ref >=30.5)
LDLC SERPL CALC-MCNC: 126 mg/dL — ABNORMAL HIGH (ref <100)

## 2013-04-14 ENCOUNTER — Other Ambulatory Visit: Payer: Self-pay | Admitting: Nurse Practitioner

## 2013-04-14 MED ORDER — ATORVASTATIN CALCIUM 40 MG PO TABS: 40.0000 mg | ORAL_TABLET | Freq: Every day | ORAL | Status: DC

## 2013-06-02 ENCOUNTER — Encounter: Payer: Self-pay | Admitting: Family Medicine

## 2013-06-02 ENCOUNTER — Ambulatory Visit (INDEPENDENT_AMBULATORY_CARE_PROVIDER_SITE_OTHER): Payer: PRIVATE HEALTH INSURANCE | Admitting: Family Medicine

## 2013-06-02 VITALS — BP 114/70 | HR 90 | Temp 97.9°F | Ht 64.0 in | Wt 253.0 lb

## 2013-06-02 DIAGNOSIS — K219 Gastro-esophageal reflux disease without esophagitis: Secondary | ICD-10-CM

## 2013-06-02 DIAGNOSIS — G5600 Carpal tunnel syndrome, unspecified upper limb: Secondary | ICD-10-CM

## 2013-06-02 MED ORDER — OMEPRAZOLE 20 MG PO CPDR: 20.0000 mg | DELAYED_RELEASE_CAPSULE | Freq: Every day | ORAL | Status: DC

## 2013-06-02 MED ORDER — NAPROXEN 500 MG PO TABS: 500.0000 mg | ORAL_TABLET | Freq: Two times a day (BID) | ORAL | Status: DC

## 2013-06-02 NOTE — Progress Notes (Signed)
  Subjective:    Patient ID: Jacqueline Lowery, female    DOB: 1981-01-21, 32 y.o.   MRN: [id number]  HPI This 32 y.o. female presents for evaluation of hands going numb.   Review of Systems C/o CTS sx's No chest pain, SOB, HA, dizziness, vision change, N/V, diarrhea, constipation, dysuria, urinary urgency or frequency, myalgias, arthralgias or rash.     Objective:   Physical Exam  Vital signs noted  Well developed well female.  HEENT - Head atraumatic Normocephalic                Eyes - PERRLA, Conjuctiva - clear Sclera- Clear EOMI                Ears - EAC's Wnl TM's Wnl Gross Hearing WNL                Nose - Nares patent                 Throat - oropharanx wnl Respiratory - Lungs CTA bilateral Cardiac - RRR S1 and S2 without murmu. MS - Positive phalen's and tinnel's bilateral wrists     Assessment & Plan:  GERD (gastroesophageal reflux disease) - Plan: omeprazole (PRILOSEC) 20 MG capsule  Carpal tunnel syndrome, unspecified laterality - Naprosyn 500mg  one po bidx 15 days Continue using cock up splints Follow up prn if sx's continue or persist.  Deatra Canter FNP

## 2013-06-02 NOTE — Patient Instructions (Signed)

## 2013-06-12 ENCOUNTER — Other Ambulatory Visit: Payer: Self-pay | Admitting: Family Medicine

## 2013-07-18 ENCOUNTER — Other Ambulatory Visit: Payer: Self-pay

## 2013-07-18 NOTE — Telephone Encounter (Signed)
Last seen 06/02/13  B Oxford  This med not on EPIC list

## 2013-07-19 ENCOUNTER — Ambulatory Visit (INDEPENDENT_AMBULATORY_CARE_PROVIDER_SITE_OTHER): Payer: PRIVATE HEALTH INSURANCE | Admitting: Family Medicine

## 2013-07-19 ENCOUNTER — Encounter: Payer: Self-pay | Admitting: Family Medicine

## 2013-07-19 VITALS — BP 124/87 | HR 95 | Temp 98.1°F | Ht 64.0 in | Wt 266.2 lb

## 2013-07-19 DIAGNOSIS — J209 Acute bronchitis, unspecified: Secondary | ICD-10-CM

## 2013-07-19 MED ORDER — LEVALBUTEROL HCL 1.25 MG/0.5ML IN NEBU: 1.2500 mg | INHALATION_SOLUTION | Freq: Once | RESPIRATORY_TRACT | Status: DC

## 2013-07-19 MED ORDER — ALBUTEROL SULFATE HFA 108 (90 BASE) MCG/ACT IN AERS: INHALATION_SPRAY | RESPIRATORY_TRACT | Status: DC

## 2013-07-19 MED ORDER — LEVOFLOXACIN 500 MG PO TABS: 500.0000 mg | ORAL_TABLET | Freq: Every day | ORAL | Status: DC

## 2013-07-19 MED ORDER — TRIAMCINOLONE ACETONIDE 40 MG/ML IJ SUSP: 60.0000 mg | Freq: Once | INTRAMUSCULAR | Status: DC

## 2013-07-19 MED ORDER — HYDROCODONE-HOMATROPINE 5-1.5 MG/5ML PO SYRP: 5.0000 mL | ORAL_SOLUTION | Freq: Four times a day (QID) | ORAL | Status: DC | PRN

## 2013-07-19 NOTE — Progress Notes (Signed)
  Subjective:    Patient ID: Jacqueline Lowery, female    DOB: 01/18/1981, 32 y.o.   MRN: [id number]  HPI This 32 y.o. female presents for evaluation of cough and congestion. She has been having cough for a week and now it is worse at night and she Is having difficulty sleeping and having difficulty with wheezing at night. .   Review of Systems C/o cough and congestion. No chest pain, SOB, HA, dizziness, vision change, N/V, diarrhea, constipation, dysuria, urinary urgency or frequency, myalgias, arthralgias or rash.     Objective:   Physical Exam  Vital signs noted  Well developed well nourished female.  HEENT - Head atraumatic Normocephalic                Eyes - PERRLA, Conjuctiva - clear Sclera- Clear EOMI                Ears - EAC's Wnl TM's Wnl Gross Hearing WNL                Throat - oropharanx wnl Respiratory - Lungs diminished bilateral and after neb good air movement and CTA Cardiac - RRR S1 and S2 without murmur GI - Abdomen soft Nontender and bowel sounds active x 4 Extremities - No edema. Neuro - Grossly intact.      Assessment & Plan:  Acute bronchitis - Plan: levofloxacin (LEVAQUIN) 500 MG tablet, HYDROcodone-homatropine (HYCODAN) 5-1.5 MG/5ML syrup, triamcinolone acetonide (KENALOG-40) injection 60 mg, levalbuterol (XOPENEX) nebulizer solution 1.25 mg. Symbicort 160/4.5 2 puffs bid #1 sample.  Push po fluids, rest, and follow up prn.  Deatra Canter FNP

## 2013-07-19 NOTE — Patient Instructions (Signed)

## 2013-09-01 ENCOUNTER — Encounter: Payer: Self-pay | Admitting: Nurse Practitioner

## 2013-09-01 ENCOUNTER — Ambulatory Visit (INDEPENDENT_AMBULATORY_CARE_PROVIDER_SITE_OTHER): Payer: PRIVATE HEALTH INSURANCE | Admitting: Nurse Practitioner

## 2013-09-01 VITALS — BP 127/90 | HR 89 | Temp 98.2°F | Ht 64.0 in | Wt 268.0 lb

## 2013-09-01 DIAGNOSIS — B9689 Other specified bacterial agents as the cause of diseases classified elsewhere: Secondary | ICD-10-CM

## 2013-09-01 DIAGNOSIS — J019 Acute sinusitis, unspecified: Secondary | ICD-10-CM

## 2013-09-01 DIAGNOSIS — R059 Cough, unspecified: Secondary | ICD-10-CM

## 2013-09-01 DIAGNOSIS — R05 Cough: Secondary | ICD-10-CM

## 2013-09-01 MED ORDER — AZITHROMYCIN 250 MG PO TABS: ORAL_TABLET | ORAL | Status: DC

## 2013-09-01 MED ORDER — HYDROCODONE-HOMATROPINE 5-1.5 MG/5ML PO SYRP: 5.0000 mL | ORAL_SOLUTION | Freq: Three times a day (TID) | ORAL | Status: DC | PRN

## 2013-09-01 NOTE — Patient Instructions (Signed)

## 2013-09-01 NOTE — Progress Notes (Signed)
   Subjective:    Patient ID: Jacqueline Lowery, female    DOB: [birth year], 32 y.o.   MRN: [id number]  HPI Patient in today c/o cough and congestion- started 3 days ago- no better    Review of Systems  Constitutional: Negative for fever, chills and appetite change.  HENT: Positive for congestion, postnasal drip, rhinorrhea and sinus pressure. Negative for sore throat and trouble swallowing.   Respiratory: Positive for cough (dry cough).   Cardiovascular: Negative.        Objective:   Physical Exam  Constitutional: She is oriented to person, place, and time. She appears well-developed and well-nourished.  HENT:  Head: Normocephalic.  Right Ear: Hearing, tympanic membrane, external ear and ear canal normal.  Left Ear: Hearing, tympanic membrane, external ear and ear canal normal.  Nose: Mucosal edema and rhinorrhea present. Right sinus exhibits maxillary sinus tenderness and frontal sinus tenderness. Left sinus exhibits maxillary sinus tenderness.  Mouth/Throat: Uvula is midline, oropharynx is clear and moist and mucous membranes are normal.  Eyes: Pupils are equal, round, and reactive to light.  Neck: Normal range of motion. Neck supple.  Cardiovascular: Normal rate and normal heart sounds.   Pulmonary/Chest: Effort normal and breath sounds normal.  Lymphadenopathy:    She has no cervical adenopathy.  Neurological: She is alert and oriented to person, place, and time.  Skin: Skin is warm and dry.  BP 127/90  Pulse 89  Temp(Src) 98.2 F (36.8 C) (Oral)  Ht 5\' 4"  (1.626 m)  Wt 268 lb (121.564 kg)  BMI 45.98 kg/m2         Assessment & Plan:   1. Acute bacterial rhinosinusitis   2. Cough    Meds ordered this encounter  Medications  . azithromycin (ZITHROMAX Z-PAK) 250 MG tablet    Sig: As directed    Dispense:  6 each    Refill:  0    Order Specific Question:  Supervising Provider    Answer:  Ernestina Penna [[id number]]  . HYDROcodone-homatropine (HYCODAN) 5-1.5 MG/5ML  syrup    Sig: Take 5 mLs by mouth every 8 (eight) hours as needed for cough.    Dispense:  120 mL    Refill:  0    Order Specific Question:  Supervising Provider    Answer:  Ernestina Penna [[id number]]   1. Take meds as prescribed 2. Use a cool mist humidifier especially during the winter months and when heat has  been humid. 3. Use saline nose sprays frequently 4. Saline irrigations of the nose can be very helpful if done frequently.  * 4X daily for 1 week*  * Use of a nettie pot can be helpful with this. Follow directions with this* 5. Drink plenty of fluids 6. Keep thermostat turn down low 7.For any cough or congestion  Use plain Mucinex- regular strength or max strength is fine   * Children- consult with Pharmacist for dosing 8. For fever or aces or pains- take tylenol or ibuprofen appropriate for age and weight.  * for fevers greater than 101 orally you may alternate ibuprofen and tylenol every  3 hours.   Mary-Margaret Daphine Deutscher, FNP

## 2013-09-06 ENCOUNTER — Other Ambulatory Visit: Payer: Self-pay | Admitting: Family Medicine

## 2013-10-17 ENCOUNTER — Ambulatory Visit (INDEPENDENT_AMBULATORY_CARE_PROVIDER_SITE_OTHER): Payer: PRIVATE HEALTH INSURANCE | Admitting: Family Medicine

## 2013-10-17 ENCOUNTER — Telehealth: Payer: Self-pay | Admitting: Family Medicine

## 2013-10-17 ENCOUNTER — Encounter: Payer: Self-pay | Admitting: Family Medicine

## 2013-10-17 VITALS — BP 134/89 | HR 91 | Temp 97.7°F | Ht 64.0 in | Wt 262.0 lb

## 2013-10-17 DIAGNOSIS — L0390 Cellulitis, unspecified: Secondary | ICD-10-CM

## 2013-10-17 DIAGNOSIS — L0291 Cutaneous abscess, unspecified: Secondary | ICD-10-CM

## 2013-10-17 MED ORDER — SULFAMETHOXAZOLE-TMP DS 800-160 MG PO TABS: 1.0000 | ORAL_TABLET | Freq: Two times a day (BID) | ORAL | Status: DC

## 2013-10-17 NOTE — Progress Notes (Signed)
   Subjective:    Patient ID: Ouida SillsJaime W Impson, female    DOB: 12/15/1980, 33 y.o.   MRN: [id number]  HPI This 33 y.o. female presents for evaluation of soreness and tenderness in her left hand radiating Up her arm.  She states she was trimming a rose bush a few days ago.   Review of Systems No chest pain, SOB, HA, dizziness, vision change, N/V, diarrhea, constipation, dysuria, urinary urgency or frequency, myalgias, arthralgias or rash.     Objective:   Physical Exam  Vital signs noted  Well developed well nourished female.  HEENT - Head atraumatic Normocephalic                Eyes - PERRLA, Conjuctiva - clear Sclera- Clear EOMI                Ears - EAC's Wnl TM's Wnl Gross Hearing WNL Respiratory - Lungs CTA bilateral Cardiac - RRR S1 and S2 without murmur. Skin - Left hand and wrist tender with palpation and right forearm with streaking and chordy veins           Left forearm.      Assessment & Plan:  Cellulitis - Plan: sulfamethoxazole-trimethoprim (BACTRIM DS) 800-160 MG per tablet Po bid x 10 days #20, warm compresses to left arm and follow up prn if not getting better.  Deatra CanterWilliam J Oxford FNP

## 2013-10-17 NOTE — Telephone Encounter (Signed)
appt made for today 

## 2013-10-17 NOTE — Patient Instructions (Signed)
Cellulitis Cellulitis is an infection of the skin and the tissue beneath it. The infected area is usually red and tender. Cellulitis occurs most often in the arms and lower legs.  CAUSES  Cellulitis is caused by bacteria that enter the skin through cracks or cuts in the skin. The most common types of bacteria that cause cellulitis are Staphylococcus and Streptococcus. SYMPTOMS   Redness and warmth.  Swelling.  Tenderness or pain.  Fever. DIAGNOSIS  Your caregiver can usually determine what is wrong based on a physical exam. Blood tests may also be done. TREATMENT  Treatment usually involves taking an antibiotic medicine. HOME CARE INSTRUCTIONS   Take your antibiotics as directed. Finish them even if you start to feel better.  Keep the infected arm or leg elevated to reduce swelling.  Apply a warm cloth to the affected area up to 4 times per day to relieve pain.  Only take over-the-counter or prescription medicines for pain, discomfort, or fever as directed by your caregiver.  Keep all follow-up appointments as directed by your caregiver. SEEK MEDICAL CARE IF:   You notice red streaks coming from the infected area.  Your red area gets larger or turns dark in color.  Your bone or joint underneath the infected area becomes painful after the skin has healed.  Your infection returns in the same area or another area.  You notice a swollen bump in the infected area.  You develop new symptoms. SEEK IMMEDIATE MEDICAL CARE IF:   You have a fever.  You feel very sleepy.  You develop vomiting or diarrhea.  You have a general ill feeling (malaise) with muscle aches and pains. MAKE SURE YOU:   Understand these instructions.  Will watch your condition.  Will get help right away if you are not doing well or get worse. Document Released: 06/03/2005 Document Revised: 02/23/2012 Document Reviewed: 11/09/2011 Exit[employer] Patient Information 2014 [employer].  

## 2013-10-25 ENCOUNTER — Other Ambulatory Visit: Payer: Self-pay | Admitting: Family Medicine

## 2014-01-10 ENCOUNTER — Other Ambulatory Visit: Payer: Self-pay | Admitting: Nurse Practitioner

## 2014-04-26 ENCOUNTER — Other Ambulatory Visit: Payer: Self-pay | Admitting: Family Medicine

## 2014-05-18 ENCOUNTER — Other Ambulatory Visit: Payer: Self-pay | Admitting: Family Medicine

## 2014-05-21 NOTE — Telephone Encounter (Signed)
Last ov 2/15. 

## 2014-06-20 ENCOUNTER — Other Ambulatory Visit: Payer: Self-pay | Admitting: Family Medicine

## 2014-07-25 ENCOUNTER — Other Ambulatory Visit: Payer: Self-pay | Admitting: Family Medicine

## 2014-09-11 ENCOUNTER — Other Ambulatory Visit: Payer: Self-pay | Admitting: Family Medicine

## 2014-09-17 ENCOUNTER — Ambulatory Visit (INDEPENDENT_AMBULATORY_CARE_PROVIDER_SITE_OTHER): Payer: PRIVATE HEALTH INSURANCE | Admitting: Family Medicine

## 2014-09-17 ENCOUNTER — Encounter: Payer: Self-pay | Admitting: Family Medicine

## 2014-09-17 VITALS — BP 156/91 | HR 97 | Temp 98.5°F | Ht 64.0 in | Wt 258.0 lb

## 2014-09-17 DIAGNOSIS — J206 Acute bronchitis due to rhinovirus: Secondary | ICD-10-CM

## 2014-09-17 MED ORDER — FLUCONAZOLE 150 MG PO TABS: 150.0000 mg | ORAL_TABLET | Freq: Once | ORAL | Status: DC

## 2014-09-17 MED ORDER — LEVALBUTEROL HCL 1.25 MG/3ML IN NEBU
1.2500 mg | INHALATION_SOLUTION | Freq: Once | RESPIRATORY_TRACT | Status: AC
  Administered 2014-09-17: 1.25 mg via RESPIRATORY_TRACT

## 2014-09-17 MED ORDER — PREDNISONE 10 MG PO TABS: ORAL_TABLET | ORAL | Status: DC

## 2014-09-17 MED ORDER — METHYLPREDNISOLONE ACETATE 80 MG/ML IJ SUSP
80.0000 mg | Freq: Once | INTRAMUSCULAR | Status: AC
  Administered 2014-09-17: 80 mg via INTRAMUSCULAR

## 2014-09-17 MED ORDER — ALBUTEROL SULFATE HFA 108 (90 BASE) MCG/ACT IN AERS: INHALATION_SPRAY | RESPIRATORY_TRACT | Status: DC

## 2014-09-17 MED ORDER — LEVALBUTEROL HCL 1.25 MG/0.5ML IN NEBU: 1.2500 mg | INHALATION_SOLUTION | Freq: Once | RESPIRATORY_TRACT | Status: DC

## 2014-09-17 MED ORDER — LEVOFLOXACIN 500 MG PO TABS: 500.0000 mg | ORAL_TABLET | Freq: Every day | ORAL | Status: DC

## 2014-09-17 MED ORDER — HYDROCODONE-HOMATROPINE 5-1.5 MG/5ML PO SYRP: 5.0000 mL | ORAL_SOLUTION | Freq: Three times a day (TID) | ORAL | Status: DC | PRN

## 2014-09-17 MED ORDER — ALBUTEROL SULFATE (2.5 MG/3ML) 0.083% IN NEBU: 2.5000 mg | INHALATION_SOLUTION | Freq: Four times a day (QID) | RESPIRATORY_TRACT | Status: DC | PRN

## 2014-09-17 NOTE — Progress Notes (Signed)
   Subjective:    Patient ID: Jacqueline Lowery, female    DOB: 12/02/1980, 34 y.o.   MRN: [id number]  HPI Patient is here with c/o sob and coughing  Review of Systems No chest pain, SOB, HA, dizziness, vision change, N/V, diarrhea, constipation, dysuria, urinary urgency or frequency, myalgias, arthralgias or rash.     Objective:   Physical Exam  Vital signs noted  Well developed well nourished female.  HEENT - Head atraumatic Normocephalic                Eyes - PERRLA, Conjuctiva - clear Sclera- Clear EOMI                Ears - EAC's Wnl TM's Wnl Gross Hearing WNL                Nose - Nares patent                 Throat - oropharanx wnl Respiratory - Lungs with wheezes bilateral Cardiac - RRR S1 and S2 without murmur GI - Abdomen soft Nontender and bowel sounds active x 4 Extremities - No edema. Neuro - Grossly intact.      Assessment & Plan:  Acute bronchitis due to Rhinovirus - Plan: levofloxacin (LEVAQUIN) 500 MG tablet, methylPREDNISolone acetate (DEPO-MEDROL) injection 80 mg, levalbuterol (XOPENEX) nebulizer solution 1.25 mg, HYDROcodone-homatropine (HYCODAN) 5-1.5 MG/5ML syrup, predniSONE (DELTASONE) 10 MG tablet, albuterol (PROVENTIL) (2.5 MG/3ML) 0.083% nebulizer solution, fluconazole (DIFLUCAN) 150 MG tablet, levalbuterol (XOPENEX) nebulizer solution 1.25 mg  Deatra CanterWilliam J Hazelgrace Bonham FNP

## 2014-09-21 ENCOUNTER — Ambulatory Visit (INDEPENDENT_AMBULATORY_CARE_PROVIDER_SITE_OTHER): Payer: PRIVATE HEALTH INSURANCE | Admitting: Family Medicine

## 2014-09-21 ENCOUNTER — Encounter: Payer: Self-pay | Admitting: Family Medicine

## 2014-09-21 VITALS — BP 142/99 | HR 80 | Temp 97.1°F | Ht 64.0 in | Wt 260.0 lb

## 2014-09-21 DIAGNOSIS — I10 Essential (primary) hypertension: Secondary | ICD-10-CM

## 2014-09-21 MED ORDER — LOSARTAN POTASSIUM-HCTZ 50-12.5 MG PO TABS: 1.0000 | ORAL_TABLET | Freq: Every day | ORAL | Status: DC

## 2014-09-21 MED ORDER — OMEPRAZOLE 20 MG PO CPDR: 20.0000 mg | DELAYED_RELEASE_CAPSULE | Freq: Every day | ORAL | Status: DC

## 2014-09-21 MED ORDER — PROPRANOLOL HCL 10 MG PO TABS: 10.0000 mg | ORAL_TABLET | Freq: Three times a day (TID) | ORAL | Status: DC | PRN

## 2014-09-21 NOTE — Progress Notes (Signed)
Subjective:    Patient ID: Jacqueline Lowery, female    DOB: 15-Oct-1980, 34 y.o.   MRN: [id number]  HPI long history of blood pressure treated with propranolol. I think this beta blocker works as much for anxiety and tachycardia as does her blood pressure because her blood pressure recently has been elevated. She monitors her pressure at her work. She was seen earlier this week for bronchitis and that is improving but she still has some wheezing and a dry and tacky cough. We spent some time talking about effort options for treating her blood pressure and I think he would be good for her to continue with propranolol to take when necessary tachycardia and anxiety. It may help reduce her pressure some but I think a more traditional approach to her blood pressure is in order  34 year old female comes in today for medication refills and to follow up on tachycardia. She has noticed her blood pressure has been elevated recently and she has had some swelling in her legs.   Patient Active Problem List   Diagnosis Date Noted  . Sinusitis, acute 02/06/2013  . Seasonal allergic rhinitis 02/06/2013  . CARPAL TUNNEL SYNDROME, BILATERAL 07/29/2007  . ANXIETY, SITUATIONAL 02/25/2007  . TOE PAIN 12/27/2006  . OBESITY, MORBID 11/29/2006  . Essential hypertension 11/29/2006  . ALLERGIC RHINITIS 11/29/2006   Outpatient Encounter Prescriptions as of 09/21/2014  Medication Sig  . albuterol (PROVENTIL HFA;VENTOLIN HFA) 108 (90 BASE) MCG/ACT inhaler 2 puffs 4 times a day  . albuterol (PROVENTIL) (2.5 MG/3ML) 0.083% nebulizer solution Take 3 mLs (2.5 mg total) by nebulization every 6 (six) hours as needed for wheezing or shortness of breath.  Marland Kitchen HYDROcodone-homatropine (HYCODAN) 5-1.5 MG/5ML syrup Take 5 mLs by mouth every 8 (eight) hours as needed for cough.  Marland Kitchen levofloxacin (LEVAQUIN) 500 MG tablet Take 1 tablet (500 mg total) by mouth daily.  Marland Kitchen omeprazole (PRILOSEC) 20 MG capsule TAKE (1) CAPSULE DAILY  .  predniSONE (DELTASONE) 10 MG tablet Take 4 po qd x 2 days then 3 po qd x 2 days 2 po qd x 2 days and then 1 po qd x 2 days  . propranolol (INDERAL) 10 MG tablet TAKE 1 TABLET EVERY 8 TO 12 HOURS AS NEEDED  . SPRINTEC 28 0.25-35 MG-MCG tablet   . [DISCONTINUED] fluticasone (FLONASE) 50 MCG/ACT nasal spray Place 2 sprays into the nose daily.  . [DISCONTINUED] Norgestim-Eth Estrad Triphasic (TRI-SPRINTEC PO) Take by mouth.  . cetirizine (ZYRTEC) 10 MG tablet Take 10 mg by mouth daily.  . [DISCONTINUED] fluconazole (DIFLUCAN) 150 MG tablet Take 1 tablet (150 mg total) by mouth once.  . [DISCONTINUED] levalbuterol (XOPENEX) nebulizer solution 1.25 mg   . [DISCONTINUED] levalbuterol (XOPENEX) nebulizer solution 1.25 mg   . [DISCONTINUED] triamcinolone acetonide (KENALOG-40) injection 60 mg      Review of Systems  Cardiovascular: Positive for palpitations and leg swelling.  Psychiatric/Behavioral: The patient is nervous/anxious.        Objective:   Physical Exam  Constitutional: She appears well-developed and well-nourished.  Cardiovascular: Normal rate.   Pulmonary/Chest: Effort normal. She has wheezes.  Musculoskeletal: She exhibits edema.  Probable 2+ edema of the ankles  Psychiatric: She has a normal mood and affect. Her behavior is normal.    BP 142/99 mmHg  Pulse 80  Temp(Src) 97.1 F (36.2 C) (Oral)  Ht 5' 4"  (1.626 m)  Wt 260 lb (117.935 kg)  BMI 44.61 kg/m2  LMP 09/15/2014  Assessment & Plan:  1. Essential hypertension Begin more traditional approach to her blood pressure with losartan and HCTZ or Hyzaar 50/12.5 continue to use propranolol as needed for tachycardia and anxiety. There was a past history of using atorvastatin but in reviewing her labs her LDL was never really high enough to treat we talked about weight loss as a way to  treat cholesterol and have encouraged her - BMP8+EGFR  Wardell Honour MD

## 2014-09-22 LAB — BMP8+EGFR
BUN/Creatinine Ratio: 20 (ref 8–20)
BUN: 13 mg/dL (ref 6–20)
CO2: 26 mmol/L (ref 18–29)
Calcium: 9.2 mg/dL (ref 8.7–10.2)
Chloride: 99 mmol/L (ref 97–108)
Creatinine, Ser: 0.64 mg/dL (ref 0.57–1.00)
GFR calc non Af Amer: 119 mL/min/{1.73_m2} (ref >59)
GFR, EST AFRICAN AMERICAN: 138 mL/min/{1.73_m2} (ref >59)
GLUCOSE: 96 mg/dL (ref 65–99)
POTASSIUM: 3.9 mmol/L (ref 3.5–5.2)
Sodium: 140 mmol/L (ref 134–144)

## 2014-11-16 ENCOUNTER — Other Ambulatory Visit: Payer: Self-pay | Admitting: Family Medicine

## 2014-12-27 ENCOUNTER — Encounter: Payer: Self-pay | Admitting: Family Medicine

## 2014-12-27 ENCOUNTER — Ambulatory Visit (INDEPENDENT_AMBULATORY_CARE_PROVIDER_SITE_OTHER): Payer: 59 | Admitting: Family Medicine

## 2014-12-27 VITALS — BP 125/82 | HR 87 | Temp 98.0°F | Ht 64.0 in | Wt 259.0 lb

## 2014-12-27 DIAGNOSIS — R635 Abnormal weight gain: Secondary | ICD-10-CM | POA: Diagnosis not present

## 2014-12-27 DIAGNOSIS — E669 Obesity, unspecified: Secondary | ICD-10-CM | POA: Insufficient documentation

## 2014-12-27 DIAGNOSIS — I10 Essential (primary) hypertension: Secondary | ICD-10-CM | POA: Diagnosis not present

## 2014-12-27 MED ORDER — FUROSEMIDE 20 MG PO TABS: ORAL_TABLET | ORAL | Status: DC

## 2014-12-27 MED ORDER — PHENTERMINE HCL 30 MG PO CAPS: 30.0000 mg | ORAL_CAPSULE | ORAL | Status: DC

## 2014-12-27 MED ORDER — LOSARTAN POTASSIUM-HCTZ 50-12.5 MG PO TABS: 1.0000 | ORAL_TABLET | Freq: Every day | ORAL | Status: DC

## 2014-12-27 NOTE — Progress Notes (Signed)
Subjective:    Patient ID: Jacqueline Lowery, female    DOB: 1981/01/08, 34 y.o.   MRN: [id number]  HPI  34 year old here to follow-up hypertension and obesity. At her last visit we started losartan and hydrochlorothiazide. Blood pressure is much better controlled at this point. She still takes propranolol as needed for tachycardia and anxiety and I think this combination works well.  She has concerns today about dependent edema. She stands all day on her job. There are no obvious history of venous stasis disease. She does however complain of some hair loss and with edema one might consider thyroid status Chief Complaint  Patient presents with  . Hypertension    2 month f/u  . Leg Swelling    Bilateral lower swelling   Patient Active Problem List   Diagnosis Date Noted  . Hypertension   . Sinusitis, acute 02/06/2013  . Seasonal allergic rhinitis 02/06/2013  . CARPAL TUNNEL SYNDROME, BILATERAL 07/29/2007  . ANXIETY, SITUATIONAL 02/25/2007  . TOE PAIN 12/27/2006  . OBESITY, MORBID 11/29/2006  . Essential hypertension 11/29/2006  . ALLERGIC RHINITIS 11/29/2006   Outpatient Encounter Prescriptions as of 12/27/2014  Medication Sig  . cetirizine (ZYRTEC) 10 MG tablet Take 10 mg by mouth daily.  . fluticasone (FLONASE) 50 MCG/ACT nasal spray Place 1 spray into both nostrils daily.  Marland Kitchen. losartan-hydrochlorothiazide (HYZAAR) 50-12.5 MG per tablet TAKE 1 TABLET DAILY  . omeprazole (PRILOSEC) 20 MG capsule Take 1 capsule (20 mg total) by mouth daily.  . propranolol (INDERAL) 10 MG tablet Take 1 tablet (10 mg total) by mouth 3 (three) times daily as needed.  . SPRINTEC 28 0.25-35 MG-MCG tablet   . [DISCONTINUED] albuterol (PROVENTIL HFA;VENTOLIN HFA) 108 (90 BASE) MCG/ACT inhaler 2 puffs 4 times a day  . [DISCONTINUED] albuterol (PROVENTIL) (2.5 MG/3ML) 0.083% nebulizer solution Take 3 mLs (2.5 mg total) by nebulization every 6 (six) hours as needed for wheezing or shortness of breath.  .  [DISCONTINUED] HYDROcodone-homatropine (HYCODAN) 5-1.5 MG/5ML syrup Take 5 mLs by mouth every 8 (eight) hours as needed for cough.  . [DISCONTINUED] levofloxacin (LEVAQUIN) 500 MG tablet Take 1 tablet (500 mg total) by mouth daily.  . [DISCONTINUED] predniSONE (DELTASONE) 10 MG tablet Take 4 po qd x 2 days then 3 po qd x 2 days 2 po qd x 2 days and then 1 po qd x 2 days      Review of Systems  Constitutional: Negative.   HENT: Negative.   Respiratory: Negative.   Cardiovascular: Positive for leg swelling.  Genitourinary: Negative.   Neurological: Negative.        Objective:   Physical Exam  Constitutional: She appears well-developed and well-nourished.  Cardiovascular: Normal rate and regular rhythm.   Pulmonary/Chest: Effort normal and breath sounds normal.  Musculoskeletal: She exhibits edema.  There is 1-2+ edema in her ankles. She states that it will be much worse by    BP 125/82 mmHg  Pulse 87  Temp(Src) 98 F (36.7 C) (Oral)  Ht 5\' 4"  (1.626 m)  Wt 259 lb (117.482 kg)  BMI 44.44 kg/m2       Assessment & Plan:  1. Weight gain Not only does she have weight gain, there is also dependent edema in her loss. Thyroid function was checked 8 years ago. - TSH   2. Essential hypertension Blood pressure better controlled with current combination. Will add furosemide 20 mg to take when necessary for edema.    3. Severe obesity (BMI >= 40)  Patient is already doing behavior modification with diet and exercise. I think it's appropriate to help her with phentermine. We discussed of this was not long term but hopefully will help her initiate habits of eating less and does help her weight loss

## 2014-12-28 LAB — TSH: TSH: 1.12 u[IU]/mL (ref 0.450–4.500)

## 2015-03-22 ENCOUNTER — Other Ambulatory Visit: Payer: Self-pay | Admitting: Family Medicine

## 2015-03-25 NOTE — Telephone Encounter (Signed)
Last seen 12/27/14 Dr Hyacinth MeekerMiller  If approved route to nurse [facility]

## 2015-03-25 NOTE — Telephone Encounter (Signed)
rx called into pharmacy

## 2015-04-10 ENCOUNTER — Other Ambulatory Visit: Payer: Self-pay | Admitting: Family Medicine

## 2015-05-06 ENCOUNTER — Other Ambulatory Visit: Payer: Self-pay | Admitting: Family Medicine

## 2015-05-07 NOTE — Telephone Encounter (Signed)
Last seen 12/27/14  Dr Miller 

## 2015-05-20 ENCOUNTER — Other Ambulatory Visit: Payer: Self-pay | Admitting: Family Medicine

## 2015-05-21 NOTE — Telephone Encounter (Signed)
Last seen 12/27/14  Dr Miller 

## 2015-05-22 NOTE — Telephone Encounter (Signed)
I'll be happy to sign if you will print.Thanks, WS

## 2015-05-22 NOTE — Telephone Encounter (Signed)
Rx for Phentermine called into [facility] per Dr Hyacinth Meeker

## 2015-05-22 NOTE — Telephone Encounter (Signed)
Per rx info it was printed yesterday but it cannot be found. Can you print and sign for patient

## 2015-06-17 ENCOUNTER — Other Ambulatory Visit: Payer: Self-pay | Admitting: Family Medicine

## 2015-06-18 NOTE — Telephone Encounter (Signed)
The deal is we need to weigh her every month to keep refilling this prescription. She doesn't have to have an office visit but states to have a way and and a blood pressure check

## 2015-06-18 NOTE — Telephone Encounter (Signed)
Last seen 12/27/14 Dr Miller  If approved route to nurse to call into [facility] 

## 2015-06-19 NOTE — Telephone Encounter (Signed)
rx called into pharmacy and pt is aware, pt also scheduled to come in for weight check and BP check.

## 2015-07-11 ENCOUNTER — Other Ambulatory Visit: Payer: Self-pay | Admitting: Family Medicine

## 2015-07-23 ENCOUNTER — Ambulatory Visit: Payer: 59

## 2015-07-26 ENCOUNTER — Other Ambulatory Visit: Payer: Self-pay | Admitting: Family Medicine

## 2015-07-30 ENCOUNTER — Encounter (INDEPENDENT_AMBULATORY_CARE_PROVIDER_SITE_OTHER): Payer: Self-pay

## 2015-07-30 ENCOUNTER — Ambulatory Visit: Payer: 59 | Admitting: *Deleted

## 2015-07-30 NOTE — Progress Notes (Signed)
Pt came in today for weight check.

## 2015-07-31 ENCOUNTER — Other Ambulatory Visit: Payer: Self-pay | Admitting: Family Medicine

## 2015-07-31 NOTE — Telephone Encounter (Signed)
Last seen 12/27/14  Dr Hyacinth MeekerMiller  If approved route to nurse to [facility]

## 2015-08-02 NOTE — Telephone Encounter (Signed)
This was phoned in on 11/23. Order needed to be signed.

## 2015-08-02 NOTE — Telephone Encounter (Signed)
[facility] on Wed 11/23 by Joselyn GlassmanKarla Compton, LPN.

## 2015-08-13 ENCOUNTER — Other Ambulatory Visit: Payer: Self-pay | Admitting: Family Medicine

## 2015-08-14 NOTE — Telephone Encounter (Signed)
Last seen 12/27/14  Dr Hyacinth MeekerMiller

## 2015-09-06 ENCOUNTER — Other Ambulatory Visit: Payer: Self-pay | Admitting: Family Medicine

## 2015-10-03 ENCOUNTER — Other Ambulatory Visit: Payer: Self-pay | Admitting: Family Medicine

## 2015-10-08 ENCOUNTER — Telehealth: Payer: Self-pay | Admitting: Family Medicine

## 2015-10-08 NOTE — Telephone Encounter (Signed)
Patient aware she will need to be seen to get phentermine filled.

## 2015-10-16 ENCOUNTER — Ambulatory Visit (INDEPENDENT_AMBULATORY_CARE_PROVIDER_SITE_OTHER): Payer: 59 | Admitting: Family Medicine

## 2015-10-16 ENCOUNTER — Encounter: Payer: Self-pay | Admitting: Family Medicine

## 2015-10-16 VITALS — BP 110/81 | HR 93 | Temp 97.7°F | Ht 64.0 in | Wt 224.4 lb

## 2015-10-16 DIAGNOSIS — Z0000 Encounter for general adult medical examination without abnormal findings: Secondary | ICD-10-CM

## 2015-10-16 DIAGNOSIS — I10 Essential (primary) hypertension: Secondary | ICD-10-CM

## 2015-10-16 MED ORDER — PHENTERMINE HCL 30 MG PO CAPS: 30.0000 mg | ORAL_CAPSULE | Freq: Every morning | ORAL | Status: DC

## 2015-10-16 MED ORDER — OMEPRAZOLE 20 MG PO CPDR: DELAYED_RELEASE_CAPSULE | ORAL | Status: DC

## 2015-10-16 MED ORDER — LOSARTAN POTASSIUM-HCTZ 50-12.5 MG PO TABS: 1.0000 | ORAL_TABLET | Freq: Every day | ORAL | Status: DC

## 2015-10-16 MED ORDER — PROPRANOLOL HCL 10 MG PO TABS: ORAL_TABLET | ORAL | Status: DC

## 2015-10-16 NOTE — Progress Notes (Signed)
   Subjective:    Patient ID: Jacqueline Lowery, female    DOB: [birth year], 35 y.o.   MRN: [id number]  HPI 35 year old female here for a physical for her insurance. Insurance requires some measurements including waist and weight and then lab work to include lipids and fasting blood sugar. Her main issue is weight. I have given her an appetite suppressant previously and she lost from 260 down to her current weight of 224 but she has not had these pills for several months. We spent some time talking about them as a behavior modification rather than a chronic medicine that I think she is in agreement and understands that.  Patient Active Problem List   Diagnosis Date Noted  . Severe obesity (BMI >= 40) ([place]) 12/27/2014  . Hypertension   . Sinusitis, acute 02/06/2013  . Seasonal allergic rhinitis 02/06/2013  . CARPAL TUNNEL SYNDROME, BILATERAL 07/29/2007  . ANXIETY, SITUATIONAL 02/25/2007  . TOE PAIN 12/27/2006  . OBESITY, MORBID 11/29/2006  . Essential hypertension 11/29/2006  . ALLERGIC RHINITIS 11/29/2006   Outpatient Encounter Prescriptions as of 10/16/2015  Medication Sig  . cetirizine (ZYRTEC) 10 MG tablet Take 10 mg by mouth daily.  Marland Kitchen losartan-hydrochlorothiazide (HYZAAR) 50-12.5 MG tablet TAKE 1 TABLET DAILY  . omeprazole (PRILOSEC) 20 MG capsule TAKE (1) CAPSULE DAILY  . propranolol (INDERAL) 10 MG tablet TAKE (1) TABLET 3 TIMES A DAY AS NEEDED.  Marland Kitchen SPRINTEC 28 0.25-35 MG-MCG tablet   . fluticasone (FLONASE) 50 MCG/ACT nasal spray Place 1 spray into both nostrils daily. Reported on 10/16/2015  . furosemide (LASIX) 20 MG tablet Take 1 tablet as needed for edema (Patient not taking: Reported on 10/16/2015)  . phentermine 30 MG capsule TAKE 1 CAPSULE EVERY MORNING (Patient not taking: Reported on 10/16/2015)   No facility-administered encounter medications on file as of 10/16/2015.      Review of Systems  Constitutional: Negative.   HENT: Negative.   Eyes: Negative.   Respiratory:  Negative.   Cardiovascular: Negative.   Gastrointestinal: Negative.   Endocrine: Negative.   Genitourinary: Negative.   Hematological: Negative.   Psychiatric/Behavioral: Negative.        Objective:   Physical Exam  Constitutional: She is oriented to person, place, and time. She appears well-developed and well-nourished.  HENT:  Head: Normocephalic.  Right Ear: External ear normal.  Left Ear: External ear normal.  Mouth/Throat: Oropharynx is clear and moist.  Eyes: Pupils are equal, round, and reactive to light.  Neck: Normal range of motion.  Cardiovascular: Normal rate, regular rhythm and normal heart sounds.   Pulmonary/Chest: Effort normal and breath sounds normal.  Abdominal: Soft.  Neurological: She is alert and oriented to person, place, and time. She has normal reflexes.  Psychiatric: She has a normal mood and affect. Her behavior is normal.          Assessment & Plan:  1. Routine general medical examination at a health care facility For obesity exam is normal. Patient to have Pap smear done at OB/GYN office in [place] panel  2. Essential hypertension Esters well controlled on losartan and hydrochlorothiazide. Continue same  3. Morbid obesity, unspecified obesity type ([place]) We will try phentermine 30 mg I've given her enough for 4 months. Expectation is that she will come in monthly for weight and blood pressure checks to see the nurse.  Wardell Honour MD

## 2015-10-17 LAB — LIPID PANEL
CHOL/HDL RATIO: 3.8 ratio (ref 0.0–4.4)
Cholesterol, Total: 180 mg/dL (ref 100–199)
HDL: 47 mg/dL (ref >39)
LDL CALC: 108 mg/dL — AB (ref 0–99)
TRIGLYCERIDES: 126 mg/dL (ref 0–149)
VLDL CHOLESTEROL CAL: 25 mg/dL (ref 5–40)

## 2015-10-17 LAB — CMP14+EGFR
ALBUMIN: 3.8 g/dL (ref 3.5–5.5)
ALT: 23 IU/L (ref 0–32)
AST: 19 IU/L (ref 0–40)
Albumin/Globulin Ratio: 1.3 (ref 1.1–2.5)
Alkaline Phosphatase: 73 IU/L (ref 39–117)
BUN / CREAT RATIO: 16 (ref 8–20)
BUN: 11 mg/dL (ref 6–20)
Bilirubin Total: 0.3 mg/dL (ref 0.0–1.2)
CALCIUM: 9.2 mg/dL (ref 8.7–10.2)
CO2: 27 mmol/L (ref 18–29)
CREATININE: 0.69 mg/dL (ref 0.57–1.00)
Chloride: 101 mmol/L (ref 96–106)
GFR calc Af Amer: 133 mL/min/{1.73_m2} (ref >59)
GFR, EST NON AFRICAN AMERICAN: 116 mL/min/{1.73_m2} (ref >59)
GLOBULIN, TOTAL: 3 g/dL (ref 1.5–4.5)
Glucose: 85 mg/dL (ref 65–99)
Potassium: 4.5 mmol/L (ref 3.5–5.2)
SODIUM: 140 mmol/L (ref 134–144)
Total Protein: 6.8 g/dL (ref 6.0–8.5)

## 2015-11-07 ENCOUNTER — Other Ambulatory Visit: Payer: Self-pay | Admitting: *Deleted

## 2015-11-07 MED ORDER — PROPRANOLOL HCL 10 MG PO TABS: ORAL_TABLET | ORAL | Status: DC

## 2016-02-11 ENCOUNTER — Other Ambulatory Visit: Payer: Self-pay | Admitting: Family Medicine

## 2016-02-13 NOTE — Telephone Encounter (Signed)
Last seen 10/16/15 Dr Hyacinth MeekerMiller   If approved route to nurse [facility]

## 2016-02-13 NOTE — Telephone Encounter (Signed)
rx called into pharmacy

## 2016-03-19 ENCOUNTER — Other Ambulatory Visit: Payer: Self-pay | Admitting: Family Medicine

## 2016-03-24 ENCOUNTER — Other Ambulatory Visit: Payer: Self-pay | Admitting: Family Medicine

## 2016-04-20 ENCOUNTER — Other Ambulatory Visit: Payer: Self-pay | Admitting: *Deleted

## 2016-04-20 MED ORDER — ALBUTEROL SULFATE HFA 108 (90 BASE) MCG/ACT IN AERS: 2.0000 | INHALATION_SPRAY | Freq: Four times a day (QID) | RESPIRATORY_TRACT | 0 refills | Status: DC | PRN

## 2016-04-23 ENCOUNTER — Other Ambulatory Visit: Payer: Self-pay | Admitting: Family Medicine

## 2016-04-28 ENCOUNTER — Other Ambulatory Visit: Payer: Self-pay | Admitting: *Deleted

## 2016-04-28 MED ORDER — PHENTERMINE HCL 30 MG PO CAPS: 30.0000 mg | ORAL_CAPSULE | Freq: Every morning | ORAL | 1 refills | Status: DC

## 2016-04-28 MED ORDER — OMEPRAZOLE 20 MG PO CPDR: DELAYED_RELEASE_CAPSULE | ORAL | 3 refills | Status: DC

## 2016-04-28 MED ORDER — LOSARTAN POTASSIUM-HCTZ 50-12.5 MG PO TABS: 1.0000 | ORAL_TABLET | Freq: Every day | ORAL | 3 refills | Status: DC

## 2016-07-09 ENCOUNTER — Other Ambulatory Visit: Payer: Self-pay | Admitting: Family Medicine

## 2016-07-10 NOTE — Telephone Encounter (Signed)
Refill called to [facility] 

## 2016-07-21 ENCOUNTER — Ambulatory Visit (INDEPENDENT_AMBULATORY_CARE_PROVIDER_SITE_OTHER): Payer: 59 | Admitting: Family Medicine

## 2016-07-21 ENCOUNTER — Encounter: Payer: Self-pay | Admitting: Family Medicine

## 2016-07-21 DIAGNOSIS — M7711 Lateral epicondylitis, right elbow: Secondary | ICD-10-CM

## 2016-07-21 DIAGNOSIS — M7710 Lateral epicondylitis, unspecified elbow: Secondary | ICD-10-CM | POA: Insufficient documentation

## 2016-07-21 MED ORDER — MELOXICAM 15 MG PO TABS: 15.0000 mg | ORAL_TABLET | Freq: Every day | ORAL | 0 refills | Status: DC

## 2016-07-21 NOTE — Progress Notes (Signed)
   Subjective:    Patient ID: Jacqueline Lowery, female    DOB: 1981-03-03, 35 y.o.   MRN: [id number]  HPI 35 year old with pain in her right elbow area pain involves both medial and lateral epicondyles. There is no repetitive motion. She works at [employer]. She has noted that certain movements and activities tend to aggravate the pain. She has taken some when necessary ibuprofen  Patient Active Problem List   Diagnosis Date Noted  . Severe obesity (BMI >= 40) (HCC) 12/27/2014  . Hypertension   . Sinusitis, acute 02/06/2013  . Seasonal allergic rhinitis 02/06/2013  . CARPAL TUNNEL SYNDROME, BILATERAL 07/29/2007  . ANXIETY, SITUATIONAL 02/25/2007  . TOE PAIN 12/27/2006  . OBESITY, MORBID 11/29/2006  . Essential hypertension 11/29/2006  . ALLERGIC RHINITIS 11/29/2006   Outpatient Encounter Prescriptions as of 07/21/2016  Medication Sig  . cetirizine (ZYRTEC) 10 MG tablet Take 10 mg by mouth daily.  Marland Kitchen. losartan-hydrochlorothiazide (HYZAAR) 50-12.5 MG tablet Take 1 tablet by mouth daily.  Marland Kitchen. omeprazole (PRILOSEC) 20 MG capsule TAKE (1) CAPSULE DAILY  . phentermine 30 MG capsule TAKE 1 CAPSULE EVERY MORNING  . propranolol (INDERAL) 10 MG tablet TAKE (1) TABLET 3 TIMES A DAY AS NEEDED.  Marland Kitchen. SPRINTEC 28 0.25-35 MG-MCG tablet   . [DISCONTINUED] albuterol (PROVENTIL HFA;VENTOLIN HFA) 108 (90 Base) MCG/ACT inhaler Inhale 2 puffs into the lungs every 6 (six) hours as needed for wheezing or shortness of breath.  . [DISCONTINUED] fluticasone (FLONASE) 50 MCG/ACT nasal spray Place 1 spray into both nostrils daily. Reported on 10/16/2015  . [DISCONTINUED] furosemide (LASIX) 20 MG tablet Take 1 tablet as needed for edema (Patient not taking: Reported on 10/16/2015)   No facility-administered encounter medications on file as of 07/21/2016.       Review of Systems  Constitutional: Negative.   HENT: Negative.   Eyes: Negative.   Respiratory: Negative.   Cardiovascular: Negative.   Gastrointestinal:  Negative.   Endocrine: Negative.   Genitourinary: Negative.   Musculoskeletal: Positive for arthralgias.  Hematological: Negative.   Psychiatric/Behavioral: Negative.        Objective:   Physical Exam  Constitutional: She appears well-developed and well-nourished.  Musculoskeletal:  Right elbow there is tenderness on palpation of both lateral and medial epicondyles. Pain increases with gripping and with forced dorsi and plantar flexion. Unusual that it affects both medial and lateral epicondyles   BP 117/86   Pulse 97   Temp 97 F (36.1 C) (Oral)   Ht 5\' 4"  (1.626 m)   Wt 222 lb (100.7 kg)   LMP 07/20/2016   BMI 38.11 kg/m          Assessment & Plan:  1. Lateral epicondylitis of right elbow So involves medial epicondyle. Would recommend tennis elbow strap Rx meloxicam 15 mg 1 a day return in 2 weeks if not improved; consider injections  Frederica KusterStephen M Miller MD

## 2016-08-06 ENCOUNTER — Other Ambulatory Visit: Payer: Self-pay | Admitting: Family Medicine

## 2016-08-10 ENCOUNTER — Telehealth: Payer: Self-pay | Admitting: Family Medicine

## 2016-08-10 ENCOUNTER — Other Ambulatory Visit: Payer: Self-pay | Admitting: Family Medicine

## 2016-08-11 NOTE — Telephone Encounter (Signed)
lmtcb

## 2016-08-12 NOTE — Progress Notes (Deleted)
   Subjective:    Patient ID: Jacqueline Lowery, female    DOB: [birth year], 35 y.o.   MRN: [id number]  HPI    Review of Systems     Objective:   Physical Exam        Assessment & Plan:

## 2016-08-13 ENCOUNTER — Ambulatory Visit: Payer: 59 | Admitting: Family Medicine

## 2016-08-14 ENCOUNTER — Encounter: Payer: Self-pay | Admitting: Family Medicine

## 2016-08-27 NOTE — Telephone Encounter (Signed)
Patient will need to be seen.

## 2016-09-01 ENCOUNTER — Other Ambulatory Visit: Payer: Self-pay | Admitting: Family Medicine

## 2016-09-11 ENCOUNTER — Other Ambulatory Visit: Payer: Self-pay | Admitting: Family Medicine

## 2016-09-16 ENCOUNTER — Ambulatory Visit (INDEPENDENT_AMBULATORY_CARE_PROVIDER_SITE_OTHER): Payer: 59 | Admitting: Physician Assistant

## 2016-09-16 ENCOUNTER — Encounter: Payer: Self-pay | Admitting: Physician Assistant

## 2016-09-16 VITALS — BP 122/84 | HR 101 | Temp 98.9°F | Ht 64.0 in | Wt 213.6 lb

## 2016-09-16 DIAGNOSIS — J209 Acute bronchitis, unspecified: Secondary | ICD-10-CM | POA: Diagnosis not present

## 2016-09-16 MED ORDER — AZITHROMYCIN 250 MG PO TABS: ORAL_TABLET | ORAL | 0 refills | Status: DC

## 2016-09-16 MED ORDER — HYDROCODONE-HOMATROPINE 5-1.5 MG/5ML PO SYRP: 5.0000 mL | ORAL_SOLUTION | Freq: Four times a day (QID) | ORAL | 0 refills | Status: DC | PRN

## 2016-09-16 NOTE — Patient Instructions (Signed)

## 2016-09-17 NOTE — Progress Notes (Signed)
BP 122/84   Pulse (!) 101   Temp 98.9 F (37.2 C) (Oral)   Ht 5\' 4"  (1.626 m)   Wt 213 lb 9.6 oz (96.9 kg)   BMI 36.66 kg/m    Subjective:    Patient ID: Jacqueline Lowery, female    DOB: Jan 07, 1981, 36 y.o.   MRN: [id number]  HPI: Jacqueline Lowery is a 36 y.o. female presenting on 09/16/2016 for No chief complaint on file.  Patient with several days of progressing aupprt respiratory and bronchial symptoms. Initially there was more upper respiratory congestion. This progressed to having significant cough that is productive throughout the day and severe at night. There is occasional wheezing after coughing. They will sometimes have slight dyspnea on exertion. It is productive mucus that is yellow in color. Denies any blood.   Relevant past medical, surgical, family and social history reviewed and updated as indicated. Allergies and medications reviewed and updated.  Past Medical History:  Diagnosis Date  . Depression   . Hypertension   . Tachycardia     History reviewed. No pertinent surgical history.  Review of Systems  Constitutional: Positive for chills and fatigue. Negative for activity change and appetite change.  HENT: Positive for congestion, postnasal drip and sore throat.   Eyes: Negative.   Respiratory: Positive for cough and wheezing.   Cardiovascular: Negative.  Negative for chest pain, palpitations and leg swelling.  Gastrointestinal: Negative.   Genitourinary: Negative.   Musculoskeletal: Negative.   Skin: Negative.   Neurological: Positive for headaches.    Allergies as of 09/16/2016      Reactions   Penicillins    REACTION: Rash      Medication List       Accurate as of 09/16/16 11:59 PM. Always use your most recent med list.          azithromycin 250 MG tablet Commonly known as:  ZITHROMAX Z-PAK As directed   cetirizine 10 MG tablet Commonly known as:  ZYRTEC Take 10 mg by mouth daily.   HYDROcodone-homatropine 5-1.5 MG/5ML syrup Commonly  known as:  HYCODAN Take 5-10 mLs by mouth every 6 (six) hours as needed.   losartan-hydrochlorothiazide 50-12.5 MG tablet Commonly known as:  HYZAAR TAKE 1 TABLET DAILY   omeprazole 20 MG capsule Commonly known as:  PRILOSEC TAKE (1) CAPSULE DAILY   phentermine 30 MG capsule TAKE 1 CAPSULE EVERY MORNING   propranolol 10 MG tablet Commonly known as:  INDERAL TAKE (1) TABLET 3 TIMES A DAY AS NEEDED.   SPRINTEC 28 0.25-35 MG-MCG tablet Generic drug:  norgestimate-ethinyl estradiol          Objective:    BP 122/84   Pulse (!) 101   Temp 98.9 F (37.2 C) (Oral)   Ht 5\' 4"  (1.626 m)   Wt 213 lb 9.6 oz (96.9 kg)   BMI 36.66 kg/m   Allergies  Allergen Reactions  . Penicillins     REACTION: Rash    Physical Exam  Constitutional: She is oriented to person, place, and time. She appears well-developed and well-nourished.  HENT:  Head: Normocephalic and atraumatic.  Right Ear: There is drainage and tenderness.  Left Ear: There is drainage and tenderness.  Nose: Mucosal edema and rhinorrhea present. Right sinus exhibits maxillary sinus tenderness and frontal sinus tenderness. Left sinus exhibits maxillary sinus tenderness and frontal sinus tenderness.  Mouth/Throat: Oropharyngeal exudate and posterior oropharyngeal erythema present.  Eyes: Conjunctivae and EOM are normal. Pupils are equal, round, and  reactive to light.  Neck: Normal range of motion. Neck supple.  Cardiovascular: Normal rate, regular rhythm, normal heart sounds and intact distal pulses.   Pulmonary/Chest: Effort normal. She has wheezes in the right upper field and the left upper field.  Abdominal: Soft. Bowel sounds are normal.  Neurological: She is alert and oriented to person, place, and time. She has normal reflexes.  Skin: Skin is warm and dry. No rash noted.  Psychiatric: She has a normal mood and affect. Her behavior is normal. Judgment and thought content normal.        Assessment & Plan:   1.  Acute bronchitis, unspecified organism - azithromycin (ZITHROMAX Z-PAK) 250 MG tablet; As directed  Dispense: 6 tablet; Refill: 0 - HYDROcodone-homatropine (HYCODAN) 5-1.5 MG/5ML syrup; Take 5-10 mLs by mouth every 6 (six) hours as needed.  Dispense: 240 mL; Refill: 0  Continue all other maintenance medications as listed above.  Follow up plan: No Follow-up on file.  No orders of the defined types were placed in this encounter.   Educational handout given for bronchitis  Remus LofflerAngel S. Willian Donson PA-C [facility] [address]  [place], [place] [id number] [phone]   09/17/2016, 8:41 PM

## 2016-09-28 ENCOUNTER — Encounter: Payer: Self-pay | Admitting: Pediatrics

## 2016-09-28 ENCOUNTER — Ambulatory Visit (INDEPENDENT_AMBULATORY_CARE_PROVIDER_SITE_OTHER): Payer: 59 | Admitting: Pediatrics

## 2016-09-28 VITALS — BP 129/86 | HR 86 | Temp 98.2°F | Resp 22 | Ht 64.0 in | Wt 217.4 lb

## 2016-09-28 DIAGNOSIS — J45909 Unspecified asthma, uncomplicated: Secondary | ICD-10-CM | POA: Insufficient documentation

## 2016-09-28 DIAGNOSIS — I10 Essential (primary) hypertension: Secondary | ICD-10-CM

## 2016-09-28 DIAGNOSIS — J069 Acute upper respiratory infection, unspecified: Secondary | ICD-10-CM | POA: Diagnosis not present

## 2016-09-28 DIAGNOSIS — J4531 Mild persistent asthma with (acute) exacerbation: Secondary | ICD-10-CM | POA: Diagnosis not present

## 2016-09-28 MED ORDER — PREDNISONE 20 MG PO TABS: ORAL_TABLET | ORAL | 0 refills | Status: DC

## 2016-09-28 MED ORDER — SPACER/AERO CHAMBER MOUTHPIECE MISC: 1.0000 | Freq: Four times a day (QID) | 0 refills | Status: DC | PRN

## 2016-09-28 MED ORDER — MONTELUKAST SODIUM 10 MG PO TABS: 10.0000 mg | ORAL_TABLET | Freq: Every day | ORAL | 3 refills | Status: DC

## 2016-09-28 MED ORDER — BECLOMETHASONE DIPROPIONATE 80 MCG/ACT IN AERS: 2.0000 | INHALATION_SPRAY | Freq: Two times a day (BID) | RESPIRATORY_TRACT | 3 refills | Status: DC

## 2016-09-28 NOTE — Progress Notes (Signed)
  Subjective:   Patient ID: Jacqueline Lowery, female    DOB: 11-07-1980, 36 y.o.   MRN: [id number] CC: Chest Congestion; Wheezing; and Cough  HPI: Jacqueline Lowery is a 36 y.o. female presenting for Chest Congestion; Wheezing; and Cough  Now sick for about 2 weeks Started azithromycin a couple days into illness Feels like she is wheezing and coughing all the time Starts getting worse at night Using albuterol 2-3 times a day Lots of coughing and wheezing No fevers Appetite has been ok  Relevant past medical, surgical, family and social history reviewed. Allergies and medications reviewed and updated. History  Smoking Status  . Never Smoker  Smokeless Tobacco  . Never Used   ROS: Per HPI   Objective:    BP 129/86   Pulse 86   Temp 98.2 F (36.8 C) (Oral)   Resp (!) 22   Ht 5\' 4"  (1.626 m)   Wt 217 lb 6.4 oz (98.6 kg)   SpO2 100%   BMI 37.32 kg/m   Wt Readings from Last 3 Encounters:  09/28/16 217 lb 6.4 oz (98.6 kg)  09/16/16 213 lb 9.6 oz (96.9 kg)  07/21/16 222 lb (100.7 kg)    Gen: NAD, alert, cooperative with exam, NCAT EYES: EOMI, no conjunctival injection, or no icterus ENT:  TMs dull gray b/l, OP without erythema LYMPH: no cervical LAD CV: NRRR, normal S1/S2, no murmur, distal pulses 2+ b/l Resp: b/l wheezing with exp. No crackles. comfortable WOB Ext: No edema, warm Neuro: Alert and oriented MSK: normal muscle bulk  Assessment & Plan:  Jacqueline Lowery was seen today for chest congestion, wheezing and cough.  Diagnoses and all orders for this visit:  Mild persistent asthma with acute exacerbation Start prednisone Use albuterol with spacer TID Not on controller med, start inhaled corticosteroid and singulair rtc 4 weeks reassess breathing -     Spacer/Aero Chamber Mouthpiece MISC; 1 each by Does not apply route every 6 (six) hours as needed. -     montelukast (SINGULAIR) 10 MG tablet; Take 1 tablet (10 mg total) by mouth at bedtime. -     predniSONE (DELTASONE)  20 MG tablet; 2 po at same time daily for 5 days -     beclomethasone (QVAR) 80 MCG/ACT inhaler; Inhale 2 puffs into the lungs 2 (two) times daily.  Essential hypertension Adequate control, cont current meds  Follow up plan: 4 weeks Rex Krasarol Octavis Sheeler, MD [facility]

## 2016-10-03 ENCOUNTER — Other Ambulatory Visit: Payer: Self-pay | Admitting: Pediatrics

## 2016-10-03 DIAGNOSIS — J4531 Mild persistent asthma with (acute) exacerbation: Secondary | ICD-10-CM

## 2016-10-03 MED ORDER — PREDNISONE 10 MG PO TABS: ORAL_TABLET | ORAL | 0 refills | Status: DC

## 2016-10-07 ENCOUNTER — Other Ambulatory Visit: Payer: Self-pay | Admitting: Pediatrics

## 2016-10-07 ENCOUNTER — Other Ambulatory Visit: Payer: Self-pay | Admitting: Family Medicine

## 2016-10-07 ENCOUNTER — Other Ambulatory Visit: Payer: Self-pay

## 2016-10-07 DIAGNOSIS — J209 Acute bronchitis, unspecified: Secondary | ICD-10-CM

## 2016-10-07 MED ORDER — FLUTICASONE PROPIONATE 50 MCG/ACT NA SUSP: 2.0000 | Freq: Every day | NASAL | 1 refills | Status: DC

## 2016-10-07 MED ORDER — BUDESONIDE 0.5 MG/2ML IN SUSP: 0.5000 mg | Freq: Two times a day (BID) | RESPIRATORY_TRACT | 12 refills | Status: DC

## 2016-10-08 MED ORDER — HYDROCODONE-HOMATROPINE 5-1.5 MG/5ML PO SYRP: 5.0000 mL | ORAL_SOLUTION | Freq: Four times a day (QID) | ORAL | 0 refills | Status: DC | PRN

## 2016-10-08 NOTE — Telephone Encounter (Signed)
Detailed message left for patient that rx is ready to be picked up 

## 2016-10-08 NOTE — Telephone Encounter (Signed)
Tell patient she will ned to pick up rx for hycodan

## 2016-10-15 ENCOUNTER — Other Ambulatory Visit: Payer: Self-pay | Admitting: Pediatrics

## 2016-10-15 DIAGNOSIS — R062 Wheezing: Secondary | ICD-10-CM

## 2016-10-15 MED ORDER — ALBUTEROL SULFATE (2.5 MG/3ML) 0.083% IN NEBU: 2.5000 mg | INHALATION_SOLUTION | Freq: Four times a day (QID) | RESPIRATORY_TRACT | 1 refills | Status: DC | PRN

## 2016-10-15 NOTE — Progress Notes (Signed)
Not able to afford any of the inhaled corticosteroid medications generic or brandname sent in.

## 2016-11-10 ENCOUNTER — Other Ambulatory Visit: Payer: Self-pay | Admitting: Family Medicine

## 2016-12-11 ENCOUNTER — Other Ambulatory Visit: Payer: Self-pay | Admitting: Family Medicine

## 2017-01-14 ENCOUNTER — Other Ambulatory Visit: Payer: Self-pay | Admitting: Pediatrics

## 2017-01-19 ENCOUNTER — Other Ambulatory Visit: Payer: Self-pay

## 2017-01-19 ENCOUNTER — Other Ambulatory Visit: Payer: Self-pay | Admitting: Family Medicine

## 2017-01-19 DIAGNOSIS — R062 Wheezing: Secondary | ICD-10-CM

## 2017-01-19 MED ORDER — PROAIR HFA 108 (90 BASE) MCG/ACT IN AERS: 2.0000 | INHALATION_SPRAY | Freq: Four times a day (QID) | RESPIRATORY_TRACT | 0 refills | Status: DC | PRN

## 2017-01-19 MED ORDER — ALBUTEROL SULFATE (2.5 MG/3ML) 0.083% IN NEBU: 2.5000 mg | INHALATION_SOLUTION | Freq: Four times a day (QID) | RESPIRATORY_TRACT | 0 refills | Status: DC | PRN

## 2017-01-19 NOTE — Addendum Note (Signed)
Addended by: Julious PayerHOLT, Triton Heidrich D on: 01/19/2017 04:23 PM   Modules accepted: Orders

## 2017-04-26 ENCOUNTER — Other Ambulatory Visit: Payer: Self-pay | Admitting: Pediatrics

## 2017-05-04 ENCOUNTER — Encounter: Payer: Self-pay | Admitting: Pediatrics

## 2017-05-04 ENCOUNTER — Ambulatory Visit (INDEPENDENT_AMBULATORY_CARE_PROVIDER_SITE_OTHER): Payer: 59 | Admitting: Pediatrics

## 2017-05-04 VITALS — BP 120/81 | HR 77 | Temp 98.1°F | Ht 64.0 in | Wt 232.0 lb

## 2017-05-04 DIAGNOSIS — I10 Essential (primary) hypertension: Secondary | ICD-10-CM

## 2017-05-04 DIAGNOSIS — K219 Gastro-esophageal reflux disease without esophagitis: Secondary | ICD-10-CM | POA: Diagnosis not present

## 2017-05-04 DIAGNOSIS — F429 Obsessive-compulsive disorder, unspecified: Secondary | ICD-10-CM | POA: Diagnosis not present

## 2017-05-04 DIAGNOSIS — R000 Tachycardia, unspecified: Secondary | ICD-10-CM | POA: Diagnosis not present

## 2017-05-04 DIAGNOSIS — E669 Obesity, unspecified: Secondary | ICD-10-CM | POA: Diagnosis not present

## 2017-05-04 MED ORDER — PROPRANOLOL HCL 10 MG PO TABS: ORAL_TABLET | ORAL | 2 refills | Status: DC

## 2017-05-04 MED ORDER — FLUOXETINE HCL 20 MG PO TABS: 20.0000 mg | ORAL_TABLET | Freq: Every day | ORAL | 3 refills | Status: DC

## 2017-05-04 MED ORDER — OMEPRAZOLE 20 MG PO CPDR: DELAYED_RELEASE_CAPSULE | ORAL | 1 refills | Status: DC

## 2017-05-04 MED ORDER — PHENTERMINE HCL 37.5 MG PO TABS: ORAL_TABLET | ORAL | 1 refills | Status: DC

## 2017-05-04 MED ORDER — LOSARTAN POTASSIUM-HCTZ 50-12.5 MG PO TABS: 1.0000 | ORAL_TABLET | Freq: Every day | ORAL | 1 refills | Status: DC

## 2017-05-04 NOTE — Progress Notes (Signed)
  Subjective:   Patient ID: Jacqueline Lowery, female    DOB: September 16, 1980, 36 y.o.   MRN: [id number] CC: Medication Refill  HPI: Jacqueline Lowery is a 36 y.o. female presenting for Medication Refill  GERD: takes prilosec daily, symptoms if she misses it  Easily irritable, needs things done certain ways Bothers her if laundry, household items are done or put away in ways she does like Starting to bother her kids Was on zoloft briefly, felt like a zombie, didn't feel like doing anything  Obesity: stress eats Very busy sport schedule with kids now Used to walk regularly, has stopped over the summer Has been on phentermine in the past which has helped start weight loss  Tachycardia: has been on propranolol for years to help with intermittent fast heart rate Takes about once a day  Relevant past medical, surgical, family and social history reviewed. Allergies and medications reviewed and updated. History  Smoking Status  . Never Smoker  Smokeless Tobacco  . Never Used   ROS: Per HPI   Objective:    BP 120/81   Pulse 77   Temp 98.1 F (36.7 C) (Oral)   Ht 5\' 4"  (1.626 m)   Wt 232 lb (105.2 kg)   BMI 39.82 kg/m   Wt Readings from Last 3 Encounters:  05/04/17 232 lb (105.2 kg)  09/28/16 217 lb 6.4 oz (98.6 kg)  09/16/16 213 lb 9.6 oz (96.9 kg)    Gen: NAD, alert, cooperative with exam, NCAT EYES: EOMI, no conjunctival injection, or no icterus ENT:  TMs pearly gray b/l, OP without erythema LYMPH: no cervical LAD CV: NRRR, normal S1/S2, no murmur, distal pulses 2+ b/l Resp: CTABL, no wheezes, normal WOB Abd: +BS, soft, NTND. no guarding or organomegaly Ext: No edema, warm Neuro: Alert and oriented, strength equal b/l UE and LE, coordination grossly normal MSK: normal muscle bulk Psych: nl affect, no thoughts of self harm  Assessment & Plan:  Jacqueline Lowery was seen today for medication refill.  Diagnoses and all orders for this visit:  Tachycardia Stable, has been on below for  some years with improvement of symptoms, continue -     propranolol (INDERAL) 10 MG tablet; TAKE (1) TABLET 3 TIMES A DAY AS NEEDED.  Gastroesophageal reflux disease, esophagitis presence not specified Stable, cont below -     omeprazole (PRILOSEC) 20 MG capsule; TAKE (1) CAPSULE DAILY  Essential hypertension Stable, cont below -     losartan-hydrochlorothiazide (HYZAAR) 50-12.5 MG tablet; Take 1 tablet by mouth daily.  Obsessive-compulsive disorder, unspecified type Ongoing symptoms, start below -     FLUoxetine (PROZAC) 20 MG tablet; Take 1 tablet (20 mg total) by mouth daily.  Obesity, Class II, BMI 35-39.9 Discussed lifestyle changes, wt loss strategies Start below, f/u 4 weeks Check BP at home, any worsening let me know -     phentermine (ADIPEX-P) 37.5 MG tablet; Half tablet for first two weeks, then take full tablet   Follow up plan: Return in about 4 weeks (around 06/01/2017). Rex Kras, MD [facility]

## 2017-06-16 ENCOUNTER — Other Ambulatory Visit: Payer: Self-pay

## 2017-06-16 DIAGNOSIS — J4531 Mild persistent asthma with (acute) exacerbation: Secondary | ICD-10-CM

## 2017-06-16 MED ORDER — MONTELUKAST SODIUM 10 MG PO TABS: 10.0000 mg | ORAL_TABLET | Freq: Every day | ORAL | 0 refills | Status: DC

## 2017-07-01 ENCOUNTER — Other Ambulatory Visit: Payer: Self-pay | Admitting: Pediatrics

## 2017-07-01 DIAGNOSIS — E669 Obesity, unspecified: Secondary | ICD-10-CM

## 2017-07-01 NOTE — Telephone Encounter (Signed)
Needs to be seen

## 2017-07-01 NOTE — Telephone Encounter (Signed)
LMOM to schedule appt.

## 2017-07-01 NOTE — Telephone Encounter (Signed)
Last seen 05/04/17  Dr Oswaldo DoneVincent   If approved route [facility]

## 2017-09-20 ENCOUNTER — Other Ambulatory Visit: Payer: Self-pay | Admitting: Pediatrics

## 2017-09-20 DIAGNOSIS — J4531 Mild persistent asthma with (acute) exacerbation: Secondary | ICD-10-CM

## 2017-10-07 ENCOUNTER — Other Ambulatory Visit: Payer: Self-pay | Admitting: Pediatrics

## 2017-10-07 DIAGNOSIS — K219 Gastro-esophageal reflux disease without esophagitis: Secondary | ICD-10-CM

## 2017-10-07 DIAGNOSIS — I10 Essential (primary) hypertension: Secondary | ICD-10-CM

## 2017-10-20 ENCOUNTER — Other Ambulatory Visit: Payer: Self-pay

## 2017-10-20 DIAGNOSIS — R000 Tachycardia, unspecified: Secondary | ICD-10-CM

## 2017-10-20 MED ORDER — PROPRANOLOL HCL 10 MG PO TABS: ORAL_TABLET | ORAL | 1 refills | Status: DC

## 2017-10-20 NOTE — Telephone Encounter (Signed)
Last seen 05/04/17  Dr Oswaldo DoneVincent

## 2017-10-29 ENCOUNTER — Other Ambulatory Visit: Payer: Self-pay | Admitting: Family Medicine

## 2017-10-29 ENCOUNTER — Telehealth: Payer: Self-pay | Admitting: Pediatrics

## 2017-10-29 DIAGNOSIS — J4531 Mild persistent asthma with (acute) exacerbation: Secondary | ICD-10-CM

## 2017-10-29 DIAGNOSIS — F429 Obsessive-compulsive disorder, unspecified: Secondary | ICD-10-CM

## 2017-10-29 MED ORDER — FLUOXETINE HCL 20 MG PO TABS: 20.0000 mg | ORAL_TABLET | Freq: Every day | ORAL | 0 refills | Status: DC

## 2017-10-29 MED ORDER — MONTELUKAST SODIUM 10 MG PO TABS: 10.0000 mg | ORAL_TABLET | Freq: Every day | ORAL | 0 refills | Status: DC

## 2017-10-29 NOTE — Telephone Encounter (Signed)
Refills sent to pharmacy. Patient aware.  

## 2017-10-29 NOTE — Telephone Encounter (Signed)
Done x90 days.  Further fills per PCP.

## 2017-11-02 ENCOUNTER — Other Ambulatory Visit: Payer: Self-pay | Admitting: *Deleted

## 2017-11-02 DIAGNOSIS — R000 Tachycardia, unspecified: Secondary | ICD-10-CM

## 2017-11-02 MED ORDER — PROPRANOLOL HCL 10 MG PO TABS: ORAL_TABLET | ORAL | 0 refills | Status: DC

## 2017-11-05 ENCOUNTER — Ambulatory Visit (INDEPENDENT_AMBULATORY_CARE_PROVIDER_SITE_OTHER): Payer: 59 | Admitting: Physician Assistant

## 2017-11-05 ENCOUNTER — Encounter: Payer: Self-pay | Admitting: Physician Assistant

## 2017-11-05 VITALS — BP 115/80 | HR 131 | Temp 98.3°F | Ht 64.0 in | Wt 265.6 lb

## 2017-11-05 DIAGNOSIS — J111 Influenza due to unidentified influenza virus with other respiratory manifestations: Secondary | ICD-10-CM | POA: Diagnosis not present

## 2017-11-05 DIAGNOSIS — J029 Acute pharyngitis, unspecified: Secondary | ICD-10-CM

## 2017-11-05 DIAGNOSIS — R52 Pain, unspecified: Secondary | ICD-10-CM | POA: Diagnosis not present

## 2017-11-05 LAB — VERITOR FLU A/B WAIVED
INFLUENZA A: NEGATIVE
INFLUENZA B: NEGATIVE

## 2017-11-05 LAB — CULTURE, GROUP A STREP

## 2017-11-05 LAB — RAPID STREP SCREEN (MED CTR MEBANE ONLY): STREP GP A AG, IA W/REFLEX: NEGATIVE

## 2017-11-05 MED ORDER — OSELTAMIVIR PHOSPHATE 75 MG PO CAPS: 75.0000 mg | ORAL_CAPSULE | Freq: Two times a day (BID) | ORAL | 0 refills | Status: DC

## 2017-11-05 MED ORDER — PROAIR HFA 108 (90 BASE) MCG/ACT IN AERS: 2.0000 | INHALATION_SPRAY | Freq: Four times a day (QID) | RESPIRATORY_TRACT | 1 refills | Status: DC | PRN

## 2017-11-05 MED ORDER — HYDROCODONE-HOMATROPINE 5-1.5 MG/5ML PO SYRP: 5.0000 mL | ORAL_SOLUTION | Freq: Four times a day (QID) | ORAL | 0 refills | Status: DC | PRN

## 2017-11-05 NOTE — Progress Notes (Signed)
BP 115/80   Pulse (!) 131   Temp 98.3 F (36.8 C) (Oral)   Ht 5\' 4"  (1.626 m)   Wt 265 lb 9.6 oz (120.5 kg)   BMI 45.59 kg/m    Subjective:    Patient ID: Jacqueline Lowery, female    DOB: February 09, 1981, 37 y.o.   MRN: [id number]  HPI: Jacqueline Lowery is a 37 y.o. female presenting on 11/05/2017 for Generalized Body Aches; Chills; and Sore Throat  This patient has had less than 2 days severe fever, chills, myalgias.  Complains of sinus headache and postnasal drainage. There is copious drainage at times. Associated sore throat, decreased appetite and headache.  Has been exposed to influenza.    Past Medical History:  Diagnosis Date  . Depression   . Hypertension   . Tachycardia    Relevant past medical, surgical, family and social history reviewed and updated as indicated. Interim medical history since our last visit reviewed. Allergies and medications reviewed and updated. DATA REVIEWED: CHART IN EPIC  Family History reviewed for pertinent findings.  Review of Systems  Constitutional: Positive for appetite change, chills, fatigue and fever. Negative for activity change.  HENT: Positive for congestion, postnasal drip and sore throat.   Eyes: Negative.   Respiratory: Negative for cough and wheezing.   Cardiovascular: Negative.  Negative for chest pain, palpitations and leg swelling.  Gastrointestinal: Negative.   Genitourinary: Negative.   Musculoskeletal: Positive for myalgias.  Skin: Negative.   Neurological: Positive for headaches.    Allergies as of 11/05/2017      Reactions   Penicillins    REACTION: Rash      Medication List        Accurate as of 11/05/17  4:33 PM. Always use your most recent med list.          albuterol (2.5 MG/3ML) 0.083% nebulizer solution Commonly known as:  PROVENTIL Take 3 mLs (2.5 mg total) by nebulization every 6 (six) hours as needed for wheezing or shortness of breath.   PROAIR HFA 108 (90 Base) MCG/ACT inhaler Generic drug:   albuterol Inhale 2 puffs into the lungs every 6 (six) hours as needed for wheezing or shortness of breath.   cetirizine 10 MG tablet Commonly known as:  ZYRTEC Take 10 mg by mouth daily.   FLUoxetine 20 MG tablet Commonly known as:  PROZAC Take 1 tablet (20 mg total) by mouth daily.   fluticasone 50 MCG/ACT nasal spray Commonly known as:  FLONASE Place 2 sprays into both nostrils daily.   HYDROcodone-homatropine 5-1.5 MG/5ML syrup Commonly known as:  HYCODAN Take 5-10 mLs by mouth every 6 (six) hours as needed.   losartan-hydrochlorothiazide 50-12.5 MG tablet Commonly known as:  HYZAAR TAKE 1 TABLET DAILY   montelukast 10 MG tablet Commonly known as:  SINGULAIR Take 1 tablet (10 mg total) by mouth at bedtime.   omeprazole 20 MG capsule Commonly known as:  PRILOSEC TAKE (1) CAPSULE DAILY   oseltamivir 75 MG capsule Commonly known as:  TAMIFLU Take 1 capsule (75 mg total) by mouth 2 (two) times daily.   propranolol 10 MG tablet Commonly known as:  INDERAL TAKE (1) TABLET 3 TIMES A DAY AS NEEDED.   Spacer/Aero Chamber Mouthpiece Misc 1 each by Does not apply route every 6 (six) hours as needed.   SPRINTEC 28 0.25-35 MG-MCG tablet Generic drug:  norgestimate-ethinyl estradiol          Objective:    BP 115/80  Pulse (!) 131   Temp 98.3 F (36.8 C) (Oral)   Ht 5\' 4"  (1.626 m)   Wt 265 lb 9.6 oz (120.5 kg)   BMI 45.59 kg/m   Allergies  Allergen Reactions  . Penicillins     REACTION: Rash    Wt Readings from Last 3 Encounters:  11/05/17 265 lb 9.6 oz (120.5 kg)  05/04/17 232 lb (105.2 kg)  09/28/16 217 lb 6.4 oz (98.6 kg)    Physical Exam  Constitutional: She is oriented to person, place, and time. She appears well-developed and well-nourished. No distress.  HENT:  Head: Normocephalic and atraumatic.  Right Ear: Tympanic membrane normal.  Left Ear: Tympanic membrane normal.  Nose: Mucosal edema and rhinorrhea present. Right sinus exhibits no  frontal sinus tenderness. Left sinus exhibits no frontal sinus tenderness.  Mouth/Throat: Posterior oropharyngeal erythema present. No oropharyngeal exudate or tonsillar abscesses.  Eyes: Conjunctivae and EOM are normal. Pupils are equal, round, and reactive to light.  Neck: Normal range of motion.  Cardiovascular: Normal rate, regular rhythm, normal heart sounds, intact distal pulses and normal pulses.  Pulmonary/Chest: Effort normal and breath sounds normal. No respiratory distress.  Abdominal: Soft. Bowel sounds are normal.  Neurological: She is alert and oriented to person, place, and time. She has normal reflexes.  Skin: Skin is warm and dry. No rash noted.  Psychiatric: She has a normal mood and affect. Her behavior is normal. Judgment and thought content normal.  Nursing note and vitals reviewed.       Assessment & Plan:   1. Sore throat - Rapid Strep Screen (Not at [facility])  2. Body ache - Veritor Flu A/B Waived  3. Influenza  - oseltamivir (TAMIFLU) 75 MG capsule; Take 1 capsule (75 mg total) by mouth 2 (two) times daily.  Dispense: 10 capsule; Refill: 0 - HYDROcodone-homatropine (HYCODAN) 5-1.5 MG/5ML syrup; Take 5-10 mLs by mouth every 6 (six) hours as needed.  Dispense: 240 mL; Refill: 0 - PROAIR HFA 108 (90 Base) MCG/ACT inhaler; Inhale 2 puffs into the lungs every 6 (six) hours as needed for wheezing or shortness of breath.  Dispense: 8.5 g; Refill: 1   Continue all other maintenance medications as listed above.  Follow up plan: No Follow-up on file.  Educational handout given for survey  Remus Loffler PA-C [facility] [address]  [place], [place] [address] [phone]   11/05/2017, 4:33 PM

## 2017-11-09 ENCOUNTER — Telehealth: Payer: Self-pay | Admitting: Pediatrics

## 2017-11-09 MED ORDER — AZITHROMYCIN 250 MG PO TABS: ORAL_TABLET | ORAL | 0 refills | Status: DC

## 2017-11-09 NOTE — Telephone Encounter (Signed)
Patient aware that Zpack has been sent to pharmacy 

## 2017-11-09 NOTE — Telephone Encounter (Signed)
Covering for PCP  Azithro sent in.   Murtis SinkSam Donaldson Richter, MD [facility] 11/09/2017, 10:31 AM

## 2017-11-12 ENCOUNTER — Telehealth: Payer: Self-pay | Admitting: Physician Assistant

## 2017-11-12 MED ORDER — PREDNISONE 20 MG PO TABS: ORAL_TABLET | ORAL | 0 refills | Status: DC

## 2017-11-12 NOTE — Telephone Encounter (Signed)
[facility]   Murtis SinkSam Myan Locatelli, MD [facility] 11/12/2017, 4:51 PM

## 2017-11-12 NOTE — Telephone Encounter (Signed)
Pt aware.

## 2017-11-12 NOTE — Telephone Encounter (Signed)
Patient states that she is not feeling any better. She states that it is down in her chest.  Patient is wanting to know if we can send in prednisone. She states that it has helped in the past

## 2017-11-16 ENCOUNTER — Ambulatory Visit (INDEPENDENT_AMBULATORY_CARE_PROVIDER_SITE_OTHER): Payer: 59 | Admitting: Pediatrics

## 2017-11-16 ENCOUNTER — Encounter: Payer: Self-pay | Admitting: Pediatrics

## 2017-11-16 VITALS — BP 128/96 | HR 80 | Temp 98.1°F | Ht 64.0 in | Wt 264.0 lb

## 2017-11-16 DIAGNOSIS — J4531 Mild persistent asthma with (acute) exacerbation: Secondary | ICD-10-CM

## 2017-11-16 DIAGNOSIS — R062 Wheezing: Secondary | ICD-10-CM

## 2017-11-16 DIAGNOSIS — Z6841 Body Mass Index (BMI) 40.0 and over, adult: Secondary | ICD-10-CM | POA: Diagnosis not present

## 2017-11-16 DIAGNOSIS — H6981 Other specified disorders of Eustachian tube, right ear: Secondary | ICD-10-CM

## 2017-11-16 DIAGNOSIS — I10 Essential (primary) hypertension: Secondary | ICD-10-CM | POA: Diagnosis not present

## 2017-11-16 DIAGNOSIS — F429 Obsessive-compulsive disorder, unspecified: Secondary | ICD-10-CM

## 2017-11-16 DIAGNOSIS — R000 Tachycardia, unspecified: Secondary | ICD-10-CM

## 2017-11-16 DIAGNOSIS — K219 Gastro-esophageal reflux disease without esophagitis: Secondary | ICD-10-CM

## 2017-11-16 LAB — BMP8+EGFR
BUN/Creatinine Ratio: 18 (ref 9–23)
BUN: 14 mg/dL (ref 6–20)
CALCIUM: 9.1 mg/dL (ref 8.7–10.2)
CHLORIDE: 97 mmol/L (ref 96–106)
CO2: 27 mmol/L (ref 20–29)
Creatinine, Ser: 0.76 mg/dL (ref 0.57–1.00)
GFR calc non Af Amer: 103 mL/min/{1.73_m2} (ref >59)
GFR, EST AFRICAN AMERICAN: 118 mL/min/{1.73_m2} (ref >59)
Glucose: 88 mg/dL (ref 65–99)
Potassium: 3.8 mmol/L (ref 3.5–5.2)
Sodium: 140 mmol/L (ref 134–144)

## 2017-11-16 LAB — BAYER DCA HB A1C WAIVED: HB A1C: 4.7 % (ref <7.0)

## 2017-11-16 MED ORDER — PROPRANOLOL HCL 10 MG PO TABS: ORAL_TABLET | ORAL | 2 refills | Status: DC

## 2017-11-16 MED ORDER — MONTELUKAST SODIUM 10 MG PO TABS: 10.0000 mg | ORAL_TABLET | Freq: Every day | ORAL | 2 refills | Status: DC

## 2017-11-16 MED ORDER — FLUOXETINE HCL 20 MG PO TABS: 20.0000 mg | ORAL_TABLET | Freq: Every day | ORAL | 2 refills | Status: DC

## 2017-11-16 MED ORDER — LOSARTAN POTASSIUM-HCTZ 50-12.5 MG PO TABS: 1.0000 | ORAL_TABLET | Freq: Every day | ORAL | 1 refills | Status: DC

## 2017-11-16 MED ORDER — ALBUTEROL SULFATE (2.5 MG/3ML) 0.083% IN NEBU: 2.5000 mg | INHALATION_SOLUTION | Freq: Four times a day (QID) | RESPIRATORY_TRACT | 0 refills | Status: DC | PRN

## 2017-11-16 MED ORDER — OMEPRAZOLE 20 MG PO CPDR: 20.0000 mg | DELAYED_RELEASE_CAPSULE | Freq: Every day | ORAL | 2 refills | Status: DC

## 2017-11-16 NOTE — Progress Notes (Signed)
Subjective:   Patient ID: Jacqueline Lowery, female    DOB: 1980/12/08, 37 y.o.   MRN: [id number] CC: Medication Refill  HPI: Jacqueline Lowery is a 37 y.o. female presenting for Medication Refill  Asthma: has had URI symptoms past two weeks.  He was treated with Tamiflu for probable flu 12 days ago.  She is having worsening breathing problems and right ear popping and pressure so was started on prednisone 4 days ago, azithromycin at the end of last week.  She still has 2 more days of prednisone and is now finished with the azithromycin.  No more fevers.  Appetite has been down, she has been trying to keep from eating too much while on the prednisone. Ear still feels stopped up x 4 days.  Has also been taking Mucinex D for the last few days.  She thinks her asthma has been better controlled over the last year since being on the Singulair.  She is not able to afford the Qvar.  She rarely needs to use the albuterol.  OCD: Taking fluoxetine.  She is not sure if it is helping her symptoms or not.  Gives the example of eating too much she thinks related to OCD.  Elevated BMI: Drinking to Dr. Samson Frederic a day.  Eating out a lot now with her daughters playing ball.  Family often does not get to eat dinner until 9:00pm.  Tachycardia: Not drinking any coffee right now.  Taking propranolol 1-2 times a day.  Relevant past medical, surgical, family and social history reviewed. Allergies and medications reviewed and updated. Social History   Tobacco Use  Smoking Status Never Smoker  Smokeless Tobacco Never Used   ROS: Per HPI   Objective:    BP (!) 128/96   Pulse 80   Temp 98.1 F (36.7 C) (Oral)   Ht 5' 4"  (1.626 m)   Wt 264 lb (119.7 kg)   BMI 45.32 kg/m   Wt Readings from Last 3 Encounters:  11/16/17 264 lb (119.7 kg)  11/05/17 265 lb 9.6 oz (120.5 kg)  05/04/17 232 lb (105.2 kg)    Gen: NAD, alert, cooperative with exam, NCAT EYES: EOMI, no conjunctival injection, or no icterus ENT:  R TM  dull, with serous effusion, L TM hazy with LR, OP with mild erythema LYMPH: no cervical LAD CV: NRRR, normal S1/S2, no murmur, distal pulses 2+ b/l Resp: CTABL, no wheezes, normal WOB Abd: +BS, soft, NTND. no guarding or organomegaly Ext: No edema, warm Neuro: Alert and oriented, strength equal b/l UE and LE, coordination grossly normal MSK: normal muscle bulk  Assessment & Plan:  Jacqueline Lowery was seen today for medication refill.  Diagnoses and all orders for this visit:  Essential hypertension Elevated today, on decongestants.  Due for recheck blood work.  Will return to clinic in 4 weeks for repeat blood pressure -     losartan-hydrochlorothiazide (HYZAAR) 50-12.5 MG tablet; Take 1 tablet by mouth daily. -     BMP8+EGFR  Obsessive-compulsive disorder, unspecified type Stable -     FLUoxetine (PROZAC) 20 MG tablet; Take 1 tablet (20 mg total) by mouth daily.  Mild persistent asthma with acute exacerbation -     montelukast (SINGULAIR) 10 MG tablet; Take 1 tablet (10 mg total) by mouth at bedtime.  Gastroesophageal reflux disease, esophagitis presence not specified -     omeprazole (PRILOSEC) 20 MG capsule; Take 1 capsule (20 mg total) by mouth daily.  Tachycardia -     propranolol (  INDERAL) 10 MG tablet; TAKE (1) TABLET 3 TIMES A DAY AS NEEDED.  Wheezing -     albuterol (PROVENTIL) (2.5 MG/3ML) 0.083% nebulizer solution; Take 3 mLs (2.5 mg total) by nebulization every 6 (six) hours as needed for wheezing or shortness of breath.  BMI 45.0-49.9, adult (HCC) -     Bayer DCA Hb A1c Waived  Eustachian tube dysfunction, right Recently on prednisone, treated with azithromycin.  Recommended continuing Flonase, antihistamines.  Should improve over the next week.  Follow up plan: Return in about 4 weeks (around 12/14/2017). Assunta Found, MD [place]

## 2017-12-03 ENCOUNTER — Telehealth: Payer: Self-pay

## 2017-12-03 MED ORDER — LOSARTAN POTASSIUM 50 MG PO TABS: 50.0000 mg | ORAL_TABLET | Freq: Every day | ORAL | 1 refills | Status: DC

## 2017-12-03 MED ORDER — HYDROCHLOROTHIAZIDE 12.5 MG PO CAPS: 12.5000 mg | ORAL_CAPSULE | Freq: Every day | ORAL | 1 refills | Status: DC

## 2017-12-03 NOTE — Telephone Encounter (Signed)
Losartan HCTZ is on backorder   Can you split into two RX's

## 2017-12-04 NOTE — Telephone Encounter (Signed)
LMOVM Rx for separate meds d/t back order have been sent to pharmacy

## 2017-12-14 ENCOUNTER — Ambulatory Visit: Payer: 59 | Admitting: Pediatrics

## 2017-12-17 ENCOUNTER — Other Ambulatory Visit: Payer: Self-pay

## 2017-12-17 MED ORDER — FLUTICASONE PROPIONATE 50 MCG/ACT NA SUSP: 2.0000 | Freq: Every day | NASAL | 4 refills | Status: DC

## 2018-01-19 ENCOUNTER — Other Ambulatory Visit: Payer: Self-pay | Admitting: Pediatrics

## 2018-01-19 DIAGNOSIS — R000 Tachycardia, unspecified: Secondary | ICD-10-CM

## 2018-02-26 ENCOUNTER — Other Ambulatory Visit: Payer: Self-pay | Admitting: Pediatrics

## 2018-02-26 DIAGNOSIS — R000 Tachycardia, unspecified: Secondary | ICD-10-CM

## 2018-04-21 ENCOUNTER — Ambulatory Visit (INDEPENDENT_AMBULATORY_CARE_PROVIDER_SITE_OTHER): Payer: 59 | Admitting: Family

## 2018-04-21 ENCOUNTER — Encounter: Payer: Self-pay | Admitting: Family

## 2018-04-21 VITALS — BP 117/80 | HR 93 | Temp 98.5°F | Ht 64.0 in | Wt 273.0 lb

## 2018-04-21 DIAGNOSIS — J4531 Mild persistent asthma with (acute) exacerbation: Secondary | ICD-10-CM

## 2018-04-21 DIAGNOSIS — J0100 Acute maxillary sinusitis, unspecified: Secondary | ICD-10-CM

## 2018-04-21 MED ORDER — DOXYCYCLINE HYCLATE 100 MG PO TABS: 100.0000 mg | ORAL_TABLET | Freq: Two times a day (BID) | ORAL | 0 refills | Status: DC

## 2018-04-21 MED ORDER — PREDNISONE 10 MG (21) PO TBPK: ORAL_TABLET | ORAL | 0 refills | Status: DC

## 2018-04-21 NOTE — Progress Notes (Signed)
   Subjective:    Patient ID: Jacqueline SillsJaime W Taillon, female    DOB: Feb 05, 1981, 37 y.o.   MRN: [id number]  Chief Complaint  Patient presents with  . cough and wheezing    Cough  This is a new problem. The current episode started in the past 7 days. The problem has been unchanged. The problem occurs every few minutes. The cough is non-productive. Associated symptoms include headaches and a sore throat.  Sinusitis  This is a new problem. The current episode started yesterday. The problem is unchanged. There has been no fever. Her pain is at a severity of 7/10. The pain is moderate. Associated symptoms include congestion, coughing, headaches, a hoarse voice, sinus pressure, sneezing and a sore throat. Past treatments include nothing. The treatment provided no relief.      Review of Systems  HENT: Positive for congestion, hoarse voice, sinus pressure, sneezing and sore throat.   Respiratory: Positive for cough.   Neurological: Positive for headaches.  All other systems reviewed and are negative.      Objective:   Physical Exam  Constitutional: She is oriented to person, place, and time. She appears well-developed and well-nourished. No distress.  HENT:  Head: Normocephalic and atraumatic.  Right Ear: External ear normal.  Left Ear: External ear normal. Tympanic membrane is bulging.  Nose: Mucosal edema and rhinorrhea present.  Mouth/Throat: Posterior oropharyngeal erythema (maxiallary pressure) present.  Eyes: Pupils are equal, round, and reactive to light.  Neck: Normal range of motion. Neck supple. No thyromegaly present.  Cardiovascular: Normal rate, regular rhythm, normal heart sounds and intact distal pulses.  No murmur heard. Pulmonary/Chest: Effort normal and breath sounds normal. No respiratory distress. She has no wheezes.  Abdominal: Soft. Bowel sounds are normal. She exhibits no distension. There is no tenderness.  Musculoskeletal: Normal range of motion. She exhibits no edema  or tenderness.  Neurological: She is alert and oriented to person, place, and time. She has normal reflexes. No cranial nerve deficit.  Skin: Skin is warm and dry.  Psychiatric: She has a normal mood and affect. Her behavior is normal. Judgment and thought content normal.  Vitals reviewed.   BP 117/80   Pulse 93   Temp 98.5 F (36.9 C) (Oral)   Ht 5\' 4"  (1.626 m)   Wt 273 lb (123.8 kg)   BMI 46.86 kg/m      Assessment & Plan:  Jacqueline Lowery comes in today with chief complaint of cough and wheezing   Diagnosis and orders addressed:  1. Acute maxillary sinusitis, recurrence not specified - doxycycline (VIBRA-TABS) 100 MG tablet; Take 1 tablet (100 mg total) by mouth 2 (two) times daily.  Dispense: 20 tablet; Refill: 0  2. Mild persistent asthma with acute exacerbation - predniSONE (STERAPRED UNI-PAK 21 TAB) 10 MG (21) TBPK tablet; Use as directed  Dispense: 21 tablet; Refill: 0  We will start prednisone today I have given her a rx of doxycycline, but told not to start for a few days to see if symptoms will improve on prednisone She will start Mucinex OTC RTO if symptoms worsen or do not improve    Jannifer Rodneyhristy Borna Wessinger, FNP

## 2018-04-21 NOTE — Patient Instructions (Signed)

## 2018-05-27 ENCOUNTER — Other Ambulatory Visit: Payer: Self-pay | Admitting: Pediatrics

## 2018-07-05 ENCOUNTER — Other Ambulatory Visit: Payer: Self-pay | Admitting: Pediatrics

## 2018-07-26 ENCOUNTER — Encounter: Payer: Self-pay | Admitting: Pediatrics

## 2018-07-26 ENCOUNTER — Ambulatory Visit (INDEPENDENT_AMBULATORY_CARE_PROVIDER_SITE_OTHER): Payer: 59 | Admitting: Pediatrics

## 2018-07-26 VITALS — BP 120/85 | HR 82 | Temp 98.3°F | Ht 64.0 in | Wt 281.0 lb

## 2018-07-26 DIAGNOSIS — R000 Tachycardia, unspecified: Secondary | ICD-10-CM | POA: Diagnosis not present

## 2018-07-26 DIAGNOSIS — I10 Essential (primary) hypertension: Secondary | ICD-10-CM

## 2018-07-26 DIAGNOSIS — Z0000 Encounter for general adult medical examination without abnormal findings: Secondary | ICD-10-CM

## 2018-07-26 DIAGNOSIS — Z0001 Encounter for general adult medical examination with abnormal findings: Secondary | ICD-10-CM

## 2018-07-26 DIAGNOSIS — Z6841 Body Mass Index (BMI) 40.0 and over, adult: Secondary | ICD-10-CM

## 2018-07-26 DIAGNOSIS — J4531 Mild persistent asthma with (acute) exacerbation: Secondary | ICD-10-CM

## 2018-07-26 MED ORDER — FLUTICASONE PROPIONATE 50 MCG/ACT NA SUSP: NASAL | 3 refills | Status: DC

## 2018-07-26 MED ORDER — PHENTERMINE HCL 37.5 MG PO CAPS: 37.5000 mg | ORAL_CAPSULE | ORAL | 0 refills | Status: DC

## 2018-07-26 MED ORDER — LOSARTAN POTASSIUM 50 MG PO TABS: 50.0000 mg | ORAL_TABLET | Freq: Every day | ORAL | 3 refills | Status: DC

## 2018-07-26 MED ORDER — PROAIR HFA 108 (90 BASE) MCG/ACT IN AERS: 2.0000 | INHALATION_SPRAY | Freq: Four times a day (QID) | RESPIRATORY_TRACT | 1 refills | Status: DC | PRN

## 2018-07-26 MED ORDER — PROPRANOLOL HCL 10 MG PO TABS: ORAL_TABLET | ORAL | 3 refills | Status: DC

## 2018-07-26 MED ORDER — HYDROCHLOROTHIAZIDE 12.5 MG PO CAPS: 12.5000 mg | ORAL_CAPSULE | Freq: Every day | ORAL | 1 refills | Status: DC

## 2018-07-26 MED ORDER — MONTELUKAST SODIUM 10 MG PO TABS: 10.0000 mg | ORAL_TABLET | Freq: Every day | ORAL | 3 refills | Status: DC

## 2018-07-26 NOTE — Progress Notes (Signed)
Subjective:   Patient ID: Jacqueline SillsJaime W Naff, female    DOB: [birth year], 37 y.o.   MRN: [id number] CC: Annual Exam  HPI: Jacqueline Lowery is a [age] y.o. female   Feeling well overall.  Due for Pap smear.  Planning to schedule with gynecology soon.  Hypertension: Taking medicine regularly.  Swelling by the end of the day.  On her feet throughout the day.  Swelling is worse when she is sitting on a stool, dangling for much of the day.  Asthma: Only needing albuterol when she is sick.  Tachycardia: Taking propranolol about once a day, usually in the morning.  Elevated BMI: Over the summer was drinking several regular Dr. Alcus Dadpeppers a day.  The last couple weeks has switched to diet.  Has had some weight gain over the last few months.  Schedule remains difficult with kids playing sports, eating late or on the road.  Drinking 1-3 yetis of coffee a day.  2 tablespoons of sugar in coffee.  Has been on phentermine in the past.  Feels like she was able to curb her appetite a lot better when taking that.  Would like to retry it.  Relevant past medical, surgical, family and social history reviewed. Allergies and medications reviewed and updated. Social History   Tobacco Use  Smoking Status Never Smoker  Smokeless Tobacco Never Used   ROS: All systems negative otherwise in the HPI.  Objective:    BP 120/85   Pulse 82   Temp 98.3 F (36.8 C) (Oral)   Ht 5\' 4"  (1.626 m)   Wt 281 lb (127.5 kg)   BMI 48.23 kg/m   Wt Readings from Last 3 Encounters:  07/26/18 281 lb (127.5 kg)  04/21/18 273 lb (123.8 kg)  11/16/17 264 lb (119.7 kg)    Gen: NAD, alert, cooperative with exam, NCAT EYES: EOMI, no conjunctival injection, or no icterus ENT:  TMs pearly gray b/l, OP without erythema LYMPH: no cervical LAD CV: NRRR, normal S1/S2, no murmur, distal pulses 2+ b/l Resp: CTABL, no wheezes, normal WOB Abd: +BS, soft, NTND. Ext: No edema, warm Neuro: Alert and oriented, strength equal b/l UE and LE,  coordination grossly normal MSK: normal muscle bulk  Assessment & Plan:  Marijean NiemannJaime was seen today for annual exam.  Diagnoses and all orders for this visit:  Encounter for preventive health examination Check BMP and TSH  Tachycardia Stable, discussed decreasing caffeine intake -     propranolol (INDERAL) 10 MG tablet; TAKE (1) TABLET 3 TIMES A DAY AS NEEDED.  Mild persistent asthma with acute exacerbation Stable, cont below -     PROAIR HFA 108 (90 Base) MCG/ACT inhaler; Inhale 2 puffs into the lungs every 6 (six) hours as needed for wheezing or shortness of breath. -     montelukast (SINGULAIR) 10 MG tablet; Take 1 tablet (10 mg total) by mouth at bedtime. -     fluticasone (FLONASE) 50 MCG/ACT nasal spray; SPRAY 2 SPRAYS INTO EACH NOSTRIL EVERY DAY  Essential hypertension Stable, follow BPs on phentermine at home -     losartan (COZAAR) 50 MG tablet; Take 1 tablet (50 mg total) by mouth daily. -     hydrochlorothiazide (MICROZIDE) 12.5 MG capsule; Take 1-2 capsules (12.5-25 mg total) by mouth daily.  BMI 45.0-49.9, adult [facility](HCC) Prescription sent in for 4 weeks.  Must return refills, will need to check heart rate, blood pressure.  Also will follow blood pressures at work.  Over the next 4 weeks,  going to work on cutting back on sugar.  Diet drinks, cutting back on sugar.  Also would like to cut back on caffeine to 1-2 servings a day. -     phentermine 37.5 MG capsule; Take 1 capsule (37.5 mg total) by mouth every morning.   Follow up plan: 4 weeks Rex Kras, MD [facility]

## 2018-08-23 ENCOUNTER — Other Ambulatory Visit: Payer: Self-pay | Admitting: Pediatrics

## 2018-08-23 DIAGNOSIS — I10 Essential (primary) hypertension: Secondary | ICD-10-CM

## 2018-08-24 ENCOUNTER — Other Ambulatory Visit: Payer: Self-pay | Admitting: Pediatrics

## 2018-11-14 ENCOUNTER — Other Ambulatory Visit: Payer: Self-pay | Admitting: Family Medicine

## 2018-11-14 MED ORDER — OSELTAMIVIR PHOSPHATE 75 MG PO CAPS: 75.0000 mg | ORAL_CAPSULE | Freq: Every day | ORAL | 0 refills | Status: DC

## 2018-11-16 ENCOUNTER — Other Ambulatory Visit: Payer: Self-pay | Admitting: Pediatrics

## 2018-12-05 ENCOUNTER — Other Ambulatory Visit: Payer: Self-pay | Admitting: Family

## 2018-12-05 DIAGNOSIS — J4531 Mild persistent asthma with (acute) exacerbation: Secondary | ICD-10-CM

## 2018-12-05 NOTE — Telephone Encounter (Signed)
Patient states that she has been having cough, wheezing, nasal congestion and chest congestion that has started over the weekend. Please advise

## 2018-12-05 NOTE — Telephone Encounter (Signed)
lmtcb

## 2018-12-05 NOTE — Telephone Encounter (Signed)
Can we find out what this is for?

## 2018-12-05 NOTE — Telephone Encounter (Signed)
What is this for

## 2018-12-05 NOTE — Telephone Encounter (Signed)
Last seen 07/26/18  Dr Oswaldo Done

## 2018-12-07 ENCOUNTER — Other Ambulatory Visit: Payer: Self-pay | Admitting: Physician Assistant

## 2018-12-07 DIAGNOSIS — J4531 Mild persistent asthma with (acute) exacerbation: Secondary | ICD-10-CM

## 2018-12-08 ENCOUNTER — Other Ambulatory Visit: Payer: Self-pay | Admitting: *Deleted

## 2018-12-08 DIAGNOSIS — J111 Influenza due to unidentified influenza virus with other respiratory manifestations: Secondary | ICD-10-CM

## 2018-12-08 NOTE — Addendum Note (Signed)
Addended by: Julious Payer D on: 12/08/2018 12:09 PM   Modules accepted: Orders

## 2019-02-13 ENCOUNTER — Other Ambulatory Visit: Payer: Self-pay | Admitting: Family

## 2019-05-01 ENCOUNTER — Other Ambulatory Visit: Payer: Self-pay | Admitting: Family

## 2019-05-05 ENCOUNTER — Ambulatory Visit: Payer: 59 | Admitting: Family Medicine

## 2019-06-09 ENCOUNTER — Other Ambulatory Visit: Payer: Self-pay | Admitting: Family

## 2019-06-09 DIAGNOSIS — I10 Essential (primary) hypertension: Secondary | ICD-10-CM

## 2019-06-09 MED ORDER — HYDROCHLOROTHIAZIDE 12.5 MG PO CAPS: ORAL_CAPSULE | ORAL | 0 refills | Status: DC

## 2019-08-23 ENCOUNTER — Other Ambulatory Visit: Payer: Self-pay | Admitting: *Deleted

## 2019-08-23 DIAGNOSIS — I10 Essential (primary) hypertension: Secondary | ICD-10-CM

## 2019-08-23 NOTE — Telephone Encounter (Signed)
[name] NTBS LOV 07/26/18

## 2019-08-24 ENCOUNTER — Other Ambulatory Visit: Payer: Self-pay | Admitting: *Deleted

## 2019-08-24 DIAGNOSIS — J4531 Mild persistent asthma with (acute) exacerbation: Secondary | ICD-10-CM

## 2019-08-28 ENCOUNTER — Other Ambulatory Visit: Payer: Self-pay | Admitting: *Deleted

## 2019-08-28 DIAGNOSIS — J4531 Mild persistent asthma with (acute) exacerbation: Secondary | ICD-10-CM

## 2019-08-28 DIAGNOSIS — I10 Essential (primary) hypertension: Secondary | ICD-10-CM

## 2019-08-28 MED ORDER — LOSARTAN POTASSIUM 50 MG PO TABS: 50.0000 mg | ORAL_TABLET | Freq: Every day | ORAL | 0 refills | Status: DC

## 2019-08-28 MED ORDER — MONTELUKAST SODIUM 10 MG PO TABS: 10.0000 mg | ORAL_TABLET | Freq: Every day | ORAL | 0 refills | Status: DC

## 2019-08-28 NOTE — Telephone Encounter (Signed)
Ins pays for 90d, has appt set for 09/11/19

## 2019-08-28 NOTE — Addendum Note (Signed)
Addended by: Antonietta Barcelona D on: 08/28/2019 04:47 PM   Modules accepted: Orders

## 2019-08-29 ENCOUNTER — Other Ambulatory Visit: Payer: Self-pay | Admitting: Family

## 2019-08-29 NOTE — Telephone Encounter (Signed)
OV Jan 21

## 2019-09-11 ENCOUNTER — Ambulatory Visit (INDEPENDENT_AMBULATORY_CARE_PROVIDER_SITE_OTHER): Payer: 59 | Admitting: Family

## 2019-09-11 ENCOUNTER — Encounter: Payer: Self-pay | Admitting: Family

## 2019-09-11 VITALS — BP 120/80

## 2019-09-11 DIAGNOSIS — J4531 Mild persistent asthma with (acute) exacerbation: Secondary | ICD-10-CM | POA: Diagnosis not present

## 2019-09-11 DIAGNOSIS — F411 Generalized anxiety disorder: Secondary | ICD-10-CM | POA: Diagnosis not present

## 2019-09-11 DIAGNOSIS — I10 Essential (primary) hypertension: Secondary | ICD-10-CM | POA: Diagnosis not present

## 2019-09-11 DIAGNOSIS — R000 Tachycardia, unspecified: Secondary | ICD-10-CM

## 2019-09-11 MED ORDER — LOSARTAN POTASSIUM 50 MG PO TABS: 50.0000 mg | ORAL_TABLET | Freq: Every day | ORAL | 1 refills | Status: DC

## 2019-09-11 MED ORDER — MONTELUKAST SODIUM 10 MG PO TABS: 10.0000 mg | ORAL_TABLET | Freq: Every day | ORAL | 1 refills | Status: DC

## 2019-09-11 MED ORDER — OMEPRAZOLE 20 MG PO CPDR: 20.0000 mg | DELAYED_RELEASE_CAPSULE | Freq: Every day | ORAL | 1 refills | Status: DC

## 2019-09-11 MED ORDER — HYDROCHLOROTHIAZIDE 12.5 MG PO CAPS: ORAL_CAPSULE | ORAL | 2 refills | Status: DC

## 2019-09-11 MED ORDER — NORGESTIMATE-ETH ESTRADIOL 0.25-35 MG-MCG PO TABS: 1.0000 | ORAL_TABLET | Freq: Every day | ORAL | 1 refills | Status: DC

## 2019-09-11 MED ORDER — ALBUTEROL SULFATE HFA 108 (90 BASE) MCG/ACT IN AERS: INHALATION_SPRAY | RESPIRATORY_TRACT | 2 refills | Status: DC

## 2019-09-11 MED ORDER — PROPRANOLOL HCL 10 MG PO TABS: ORAL_TABLET | ORAL | 3 refills | Status: DC

## 2019-09-11 NOTE — Progress Notes (Signed)
Virtual Visit via telephone Note Due to COVID-19 pandemic this visit was conducted virtually. This visit type was conducted due to national recommendations for restrictions regarding the COVID-19 Pandemic (e.g. social distancing, sheltering in place) in an effort to limit this patient's exposure and mitigate transmission in our community. All issues noted in this document were discussed and addressed.  A physical exam was not performed with this format.  I connected with [name] on 09/11/19 at 2:50 pm by telephone and verified that I am speaking with the correct person using two identifiers. Jacqueline Lowery is currently located at work and no one  is currently with her during visit. The provider, Evelina Dun, FNP is located in their office at time of visit.  I discussed the limitations, risks, security and privacy concerns of performing an evaluation and management service by telephone and the availability of in person appointments. I also discussed with the patient that there may be a patient responsible charge related to this service. The patient expressed understanding and agreed to proceed.   History and Present Illness:  Hypertension This is a chronic problem. The current episode started more than 1 year ago. The problem has been resolved since onset. The problem is controlled. Associated symptoms include anxiety. Pertinent negatives include no chest pain, headaches, malaise/fatigue, peripheral edema or shortness of breath. Risk factors for coronary artery disease include obesity and sedentary lifestyle. Past treatments include alpha 1 blockers and diuretics. The current treatment provides moderate improvement. There is no history of kidney disease, CAD/MI or heart failure.  Gastroesophageal Reflux She complains of belching, heartburn and nausea. She reports no chest pain, no coughing or no wheezing. This is a chronic problem. The current episode started more than 1 year ago. The  problem occurs occasionally. The problem has been waxing and waning. Risk factors include obesity. She has tried a PPI for the symptoms. The treatment provided moderate relief.  Asthma There is no cough, difficulty breathing, shortness of breath or wheezing. This is a chronic problem. The current episode started more than 1 year ago. The problem occurs intermittently. The problem has been waxing and waning. Associated symptoms include heartburn. Pertinent negatives include no chest pain, headaches or malaise/fatigue. She reports moderate improvement on treatment. Her past medical history is significant for asthma.  Anxiety Presents for follow-up visit. Symptoms include depressed mood, excessive worry, irritability, nausea, nervous/anxious behavior and restlessness. Patient reports no chest pain or shortness of breath. The severity of symptoms is moderate. The quality of sleep is good.   Her past medical history is significant for asthma.      Review of Systems  Constitutional: Positive for irritability. Negative for malaise/fatigue.  Respiratory: Negative for cough, shortness of breath and wheezing.   Cardiovascular: Negative for chest pain.  Gastrointestinal: Positive for heartburn and nausea.  Neurological: Negative for headaches.  Psychiatric/Behavioral: The patient is nervous/anxious.   All other systems reviewed and are negative.    Observations/Objective: No SOB or distress noted, pt upbeat   Assessment and Plan: Jacqueline Lowery comes in today with chief complaint of No chief complaint on file.   Diagnosis and orders addressed:  1. Essential hypertension - losartan (COZAAR) 50 MG tablet; Take 1 tablet (50 mg total) by mouth daily.  Dispense: 90 tablet; Refill: 1 - hydrochlorothiazide (MICROZIDE) 12.5 MG capsule; TAKE 1 CAPSULE BY MOUTH EVERY DAY NTBS before another refill  Dispense: 90 capsule; Refill: 2  2. Mild persistent asthma with acute exacerbation - montelukast  (  SINGULAIR) 10 MG tablet; Take 1 tablet (10 mg total) by mouth at bedtime.  Dispense: 90 tablet; Refill: 1 - albuterol (VENTOLIN HFA) 108 (90 Base) MCG/ACT inhaler; 2 PUFFS EVERY 6 HOURS AS NEEDED FOR WHEEZING  Dispense: 8.5 g; Refill: 2  3. OBESITY, MORBID  4. GAD (generalized anxiety disorder)  5. Tachycardia - propranolol (INDERAL) 10 MG tablet; TAKE (1) TABLET 3 TIMES A DAY AS NEEDED.  Dispense: 270 tablet; Refill: 3   Will hold off on lab work as she will make CPE in next 30 days Health Maintenance reviewed Diet and exercise encouraged   Follow Up Instructions: 1-2 months for CPE    I discussed the assessment and treatment plan with the patient. The patient was provided an opportunity to ask questions and all were answered. The patient agreed with the plan and demonstrated an understanding of the instructions.   The patient was advised to call back or seek an in-person evaluation if the symptoms worsen or if the condition fails to improve as anticipated.  The above assessment and management plan was discussed with the patient. The patient verbalized understanding of and has agreed to the management plan. Patient is aware to call the clinic if symptoms persist or worsen. Patient is aware when to return to the clinic for a follow-up visit. Patient educated on when it is appropriate to go to the emergency department.   Time call ended:  3:12 pm  I provided 22 minutes of non-face-to-face time during this encounter.    Jannifer Rodney, FNP

## 2020-02-21 ENCOUNTER — Other Ambulatory Visit: Payer: Self-pay | Admitting: *Deleted

## 2020-02-21 ENCOUNTER — Other Ambulatory Visit: Payer: Self-pay | Admitting: Family

## 2020-02-21 DIAGNOSIS — I10 Essential (primary) hypertension: Secondary | ICD-10-CM

## 2020-02-21 MED ORDER — NORGESTIMATE-ETH ESTRADIOL 0.25-35 MG-MCG PO TABS: 1.0000 | ORAL_TABLET | Freq: Every day | ORAL | 0 refills | Status: DC

## 2020-03-07 ENCOUNTER — Other Ambulatory Visit: Payer: Self-pay | Admitting: Family

## 2020-03-07 DIAGNOSIS — J4531 Mild persistent asthma with (acute) exacerbation: Secondary | ICD-10-CM

## 2020-04-04 ENCOUNTER — Other Ambulatory Visit: Payer: Self-pay

## 2020-04-04 MED ORDER — NORGESTIMATE-ETH ESTRADIOL 0.25-35 MG-MCG PO TABS: 1.0000 | ORAL_TABLET | Freq: Every day | ORAL | 0 refills | Status: DC

## 2020-04-25 ENCOUNTER — Other Ambulatory Visit (HOSPITAL_COMMUNITY)
Admission: RE | Admit: 2020-04-25 | Discharge: 2020-04-25 | Disposition: A | Discharge status: Home / Self Care | Payer: 59 | Source: Ambulatory Visit | Attending: Family Medicine | Admitting: Family Medicine

## 2020-04-25 ENCOUNTER — Other Ambulatory Visit: Payer: Self-pay

## 2020-04-25 ENCOUNTER — Encounter: Payer: Self-pay | Admitting: Family

## 2020-04-25 ENCOUNTER — Ambulatory Visit (INDEPENDENT_AMBULATORY_CARE_PROVIDER_SITE_OTHER): Payer: 59 | Admitting: Family

## 2020-04-25 VITALS — BP 130/90 | HR 84 | Temp 97.9°F | Ht 64.0 in | Wt 276.2 lb

## 2020-04-25 DIAGNOSIS — I10 Essential (primary) hypertension: Secondary | ICD-10-CM | POA: Diagnosis not present

## 2020-04-25 DIAGNOSIS — J4531 Mild persistent asthma with (acute) exacerbation: Secondary | ICD-10-CM | POA: Diagnosis not present

## 2020-04-25 DIAGNOSIS — Z01419 Encounter for gynecological examination (general) (routine) without abnormal findings: Secondary | ICD-10-CM | POA: Insufficient documentation

## 2020-04-25 DIAGNOSIS — R000 Tachycardia, unspecified: Secondary | ICD-10-CM

## 2020-04-25 DIAGNOSIS — Z0001 Encounter for general adult medical examination with abnormal findings: Secondary | ICD-10-CM

## 2020-04-25 DIAGNOSIS — K219 Gastro-esophageal reflux disease without esophagitis: Secondary | ICD-10-CM

## 2020-04-25 DIAGNOSIS — F411 Generalized anxiety disorder: Secondary | ICD-10-CM

## 2020-04-25 DIAGNOSIS — Z0000 Encounter for general adult medical examination without abnormal findings: Secondary | ICD-10-CM

## 2020-04-25 DIAGNOSIS — Z114 Encounter for screening for human immunodeficiency virus [HIV]: Secondary | ICD-10-CM

## 2020-04-25 DIAGNOSIS — Z1159 Encounter for screening for other viral diseases: Secondary | ICD-10-CM

## 2020-04-25 MED ORDER — PROPRANOLOL HCL 10 MG PO TABS: ORAL_TABLET | ORAL | 3 refills | Status: DC

## 2020-04-25 MED ORDER — HYDROCHLOROTHIAZIDE 12.5 MG PO CAPS: ORAL_CAPSULE | ORAL | 2 refills | Status: DC

## 2020-04-25 MED ORDER — ALBUTEROL SULFATE HFA 108 (90 BASE) MCG/ACT IN AERS: INHALATION_SPRAY | RESPIRATORY_TRACT | 2 refills | Status: DC

## 2020-04-25 MED ORDER — LOSARTAN POTASSIUM 50 MG PO TABS: 50.0000 mg | ORAL_TABLET | Freq: Every day | ORAL | 0 refills | Status: DC

## 2020-04-25 MED ORDER — MONTELUKAST SODIUM 10 MG PO TABS: ORAL_TABLET | ORAL | 2 refills | Status: DC

## 2020-04-25 MED ORDER — NORGESTIMATE-ETH ESTRADIOL 0.25-35 MG-MCG PO TABS: 1.0000 | ORAL_TABLET | Freq: Every day | ORAL | 0 refills | Status: DC

## 2020-04-25 MED ORDER — OMEPRAZOLE 20 MG PO CPDR: 20.0000 mg | DELAYED_RELEASE_CAPSULE | Freq: Every day | ORAL | 1 refills | Status: DC

## 2020-04-25 MED ORDER — FLUTICASONE PROPIONATE 50 MCG/ACT NA SUSP: NASAL | 3 refills | Status: DC

## 2020-04-25 NOTE — Patient Instructions (Signed)
Health Maintenance, Female Adopting a healthy lifestyle and getting preventive care are important in promoting health and wellness. Ask your health care provider about:  The right schedule for you to have regular tests and exams.  Things you can do on your own to prevent diseases and keep yourself healthy. What should I know about diet, weight, and exercise? Eat a healthy diet   Eat a diet that includes plenty of vegetables, fruits, low-fat dairy products, and lean protein.  Do not eat a lot of foods that are high in solid fats, added sugars, or sodium. Maintain a healthy weight Body mass index (BMI) is used to identify weight problems. It estimates body fat based on height and weight. Your health care provider can help determine your BMI and help you achieve or maintain a healthy weight. Get regular exercise Get regular exercise. This is one of the most important things you can do for your health. Most adults should:  Exercise for at least 150 minutes each week. The exercise should increase your heart rate and make you sweat (moderate-intensity exercise).  Do strengthening exercises at least twice a week. This is in addition to the moderate-intensity exercise.  Spend less time sitting. Even light physical activity can be beneficial. Watch cholesterol and blood lipids Have your blood tested for lipids and cholesterol at 37 years of age, then have this test every 5 years. Have your cholesterol levels checked more often if:  Your lipid or cholesterol levels are high.  You are older than 37 years of age.  You are at high risk for heart disease. What should I know about cancer screening? Depending on your health history and family history, you may need to have cancer screening at various ages. This may include screening for:  Breast cancer.  Cervical cancer.  Colorectal cancer.  Skin cancer.  Lung cancer. What should I know about heart disease, diabetes, and high blood  pressure? Blood pressure and heart disease  High blood pressure causes heart disease and increases the risk of stroke. This is more likely to develop in people who have high blood pressure readings, are of African descent, or are overweight.  Have your blood pressure checked: ? Every 3-5 years if you are 18-39 years of age. ? Every year if you are 40 years old or older. Diabetes Have regular diabetes screenings. This checks your fasting blood sugar level. Have the screening done:  Once every three years after age 40 if you are at a normal weight and have a low risk for diabetes.  More often and at a younger age if you are overweight or have a high risk for diabetes. What should I know about preventing infection? Hepatitis B If you have a higher risk for hepatitis B, you should be screened for this virus. Talk with your health care provider to find out if you are at risk for hepatitis B infection. Hepatitis C Testing is recommended for:  Everyone born from 1945 through 1965.  Anyone with known risk factors for hepatitis C. Sexually transmitted infections (STIs)  Get screened for STIs, including gonorrhea and chlamydia, if: ? You are sexually active and are younger than 37 years of age. ? You are older than 37 years of age and your health care provider tells you that you are at risk for this type of infection. ? Your sexual activity has changed since you were last screened, and you are at increased risk for chlamydia or gonorrhea. Ask your health care provider if   you are at risk.  Ask your health care provider about whether you are at high risk for HIV. Your health care provider may recommend a prescription medicine to help prevent HIV infection. If you choose to take medicine to prevent HIV, you should first get tested for HIV. You should then be tested every 3 months for as long as you are taking the medicine. Pregnancy  If you are about to stop having your period (premenopausal) and  you may become pregnant, seek counseling before you get pregnant.  Take 400 to 800 micrograms (mcg) of folic acid every day if you become pregnant.  Ask for birth control (contraception) if you want to prevent pregnancy. Osteoporosis and menopause Osteoporosis is a disease in which the bones lose minerals and strength with aging. This can result in bone fractures. If you are 65 years old or older, or if you are at risk for osteoporosis and fractures, ask your health care provider if you should:  Be screened for bone loss.  Take a calcium or vitamin D supplement to lower your risk of fractures.  Be given hormone replacement therapy (HRT) to treat symptoms of menopause. Follow these instructions at home: Lifestyle  Do not use any products that contain nicotine or tobacco, such as cigarettes, e-cigarettes, and chewing tobacco. If you need help quitting, ask your health care provider.  Do not use street drugs.  Do not share needles.  Ask your health care provider for help if you need support or information about quitting drugs. Alcohol use  Do not drink alcohol if: ? Your health care provider tells you not to drink. ? You are pregnant, may be pregnant, or are planning to become pregnant.  If you drink alcohol: ? Limit how much you use to 0-1 drink a day. ? Limit intake if you are breastfeeding.  Be aware of how much alcohol is in your drink. In the [place] one drink equals one 12 oz bottle of beer (355 mL), one 5 oz glass of wine (148 mL), or one 1 oz glass of hard liquor (44 mL). General instructions  Schedule regular health, dental, and eye exams.  Stay current with your vaccines.  Tell your health care provider if: ? You often feel depressed. ? You have ever been abused or do not feel safe at home. Summary  Adopting a healthy lifestyle and getting preventive care are important in promoting health and wellness.  Follow your health care provider's instructions about healthy  diet, exercising, and getting tested or screened for diseases.  Follow your health care provider's instructions on monitoring your cholesterol and blood pressure. This information is not intended to replace advice given to you by your health care provider. Make sure you discuss any questions you have with your health care provider. Document Revised: 08/17/2018 Document Reviewed: 08/17/2018 Elsevier Patient Education  2020 [employer]  

## 2020-04-25 NOTE — Progress Notes (Signed)
Subjective:    Patient ID: Jacqueline Lowery, female    DOB: 1981-06-19, 39 y.o.   MRN: [id number]  Chief Complaint  Patient presents with  . Annual Exam    with pap   Pt presents to the office today for CPE with pap.  Hypertension This is a chronic problem. The current episode started more than 1 year ago. The problem has been resolved since onset. The problem is controlled. Associated symptoms include anxiety and peripheral edema ("some days"). Pertinent negatives include no malaise/fatigue or shortness of breath. Risk factors for coronary artery disease include dyslipidemia, obesity and sedentary lifestyle. The current treatment provides moderate improvement. There is no history of CVA or heart failure.  Asthma There is no cough, shortness of breath or wheezing. This is a chronic problem. The current episode started more than 1 year ago. The problem occurs intermittently. The problem has been waxing and waning. Associated symptoms include heartburn. Pertinent negatives include no malaise/fatigue. Her symptoms are alleviated by leukotriene antagonist. She reports moderate improvement on treatment. Her past medical history is significant for asthma.  Anxiety Presents for follow-up visit. Symptoms include depressed mood, excessive worry, irritability, nervous/anxious behavior and panic. Patient reports no shortness of breath. Symptoms occur occasionally. The severity of symptoms is moderate.   Her past medical history is significant for asthma.  Gastroesophageal Reflux She complains of belching and heartburn. She reports no coughing or no wheezing. This is a chronic problem. The current episode started more than 1 year ago. The problem occurs occasionally. She has tried a PPI for the symptoms. The treatment provided moderate relief.      Review of Systems  Constitutional: Positive for irritability. Negative for malaise/fatigue.  Respiratory: Negative for cough, shortness of breath and  wheezing.   Gastrointestinal: Positive for heartburn.  Psychiatric/Behavioral: The patient is nervous/anxious.   All other systems reviewed and are negative.  Family History  Problem Relation Age of Onset  . Hypertension Mother   . Hypertension Father   . Heart disease Maternal Grandmother    Social History   Socioeconomic History  . Marital status: Married    Spouse name: Not on file  . Number of children: Not on file  . Years of education: Not on file  . Highest education level: Not on file  Occupational History  . Not on file  Tobacco Use  . Smoking status: Never Smoker  . Smokeless tobacco: Never Used  Vaping Use  . Vaping Use: Never used  Substance and Sexual Activity  . Alcohol use: No  . Drug use: No  . Sexual activity: Not on file  Other Topics Concern  . Not on file  Social History Narrative  . Not on file   Social Determinants of Health   Financial Resource Strain:   . Difficulty of Paying Living Expenses: Not on file  Food Insecurity:   . Worried About Charity fundraiser in the Last Year: Not on file  . Ran Out of Food in the Last Year: Not on file  Transportation Needs:   . Lack of Transportation (Medical): Not on file  . Lack of Transportation (Non-Medical): Not on file  Physical Activity:   . Days of Exercise per Week: Not on file  . Minutes of Exercise per Session: Not on file  Stress:   . Feeling of Stress : Not on file  Social Connections:   . Frequency of Communication with Friends and Family: Not on file  . Frequency of  Social Gatherings with Friends and Family: Not on file  . Attends Religious Services: Not on file  . Active Member of Clubs or Organizations: Not on file  . Attends Archivist Meetings: Not on file  . Marital Status: Not on file       Objective:   Physical Exam Vitals reviewed.  Constitutional:      General: She is not in acute distress.    Appearance: She is well-developed. She is obese.  HENT:     Head:  Normocephalic and atraumatic.     Right Ear: Tympanic membrane normal.     Left Ear: Tympanic membrane normal.  Eyes:     Pupils: Pupils are equal, round, and reactive to light.  Neck:     Thyroid: No thyromegaly.  Cardiovascular:     Rate and Rhythm: Normal rate and regular rhythm.     Heart sounds: Normal heart sounds. No murmur heard.   Pulmonary:     Effort: Pulmonary effort is normal. No respiratory distress.     Breath sounds: Normal breath sounds. No wheezing.  Abdominal:     General: Bowel sounds are normal. There is no distension.     Palpations: Abdomen is soft.     Tenderness: There is no abdominal tenderness.  Musculoskeletal:        General: No tenderness. Normal range of motion.     Cervical back: Normal range of motion and neck supple.  Skin:    General: Skin is warm and dry.  Neurological:     Mental Status: She is alert and oriented to person, place, and time.     Cranial Nerves: No cranial nerve deficit.     Deep Tendon Reflexes: Reflexes are normal and symmetric.  Psychiatric:        Behavior: Behavior normal.        Thought Content: Thought content normal.        Judgment: Judgment normal.       BP 130/90   Pulse 84   Temp 97.9 F (36.6 C) (Temporal)   Ht _0  (1.626 m)   Wt 276 lb 3.2 oz (125.3 kg)   SpO2 97%   BMI 47.41 kg/m      Assessment & Plan:  Jacqueline Lowery comes in today with chief complaint of Annual Exam (with pap)   Diagnosis and orders addressed:  1. Essential hypertension - losartan (COZAAR) 50 MG tablet; Take 1 tablet (50 mg total) by mouth daily.  Dispense: 90 tablet; Refill: 0 - hydrochlorothiazide (MICROZIDE) 12.5 MG capsule; TAKE 1 CAPSULE BY MOUTH EVERY DAY NTBS before another refill  Dispense: 90 capsule; Refill: 2 - CMP14+EGFR - CBC with Differential/Platelet  2. Mild persistent asthma with acute exacerbation - montelukast (SINGULAIR) 10 MG tablet; TAKE 1 TABLET BY MOUTH EVERYDAY AT BEDTIME  Dispense: 90 tablet;  Refill: 2 - fluticasone (FLONASE) 50 MCG/ACT nasal spray; SPRAY 2 SPRAYS INTO EACH NOSTRIL EVERY DAY  Dispense: 16 g; Refill: 3 - albuterol (VENTOLIN HFA) 108 (90 Base) MCG/ACT inhaler; 2 PUFFS EVERY 6 HOURS AS NEEDED FOR WHEEZING  Dispense: 8.5 g; Refill: 2 - CMP14+EGFR - CBC with Differential/Platelet  3. Tachycardia - propranolol (INDERAL) 10 MG tablet; TAKE (1) TABLET 3 TIMES A DAY AS NEEDED.  Dispense: 270 tablet; Refill: 3 - CMP14+EGFR - CBC with Differential/Platelet  4. Annual physical exam - CMP14+EGFR - CBC with Differential/Platelet - Lipid panel - HIV Antibody (routine testing w rflx) - TSH - Hepatitis C antibody  5. GAD (generalized anxiety disorder) - CMP14+EGFR - CBC with Differential/Platelet  6. Severe obesity (BMI >= 40) (HCC) - CMP14+EGFR - CBC with Differential/Platelet  7. Need for hepatitis C screening test - CMP14+EGFR - CBC with Differential/Platelet - Hepatitis C antibody  8. Encounter for screening for HIV - CMP14+EGFR - CBC with Differential/Platelet - HIV Antibody (routine testing w rflx)  9. Gastroesophageal reflux disease, unspecified whether esophagitis present - omeprazole (PRILOSEC) 20 MG capsule; Take 1 capsule (20 mg total) by mouth daily.  Dispense: 90 capsule; Refill: 1  10. Gynecologic exam normal - Cytology - PAP([facility])   Labs pending Health Maintenance reviewed Diet and exercise encouraged  Follow up plan: 6 months    Evelina Dun, FNP

## 2020-04-26 LAB — CBC WITH DIFFERENTIAL/PLATELET
Basophils Absolute: 0.1 10*3/uL (ref 0.0–0.2)
Basos: 1 %
EOS (ABSOLUTE): 0.2 10*3/uL (ref 0.0–0.4)
Eos: 2 %
Hematocrit: 42.6 % (ref 34.0–46.6)
Hemoglobin: 14.6 g/dL (ref 11.1–15.9)
Immature Grans (Abs): 0 10*3/uL (ref 0.0–0.1)
Immature Granulocytes: 0 %
Lymphocytes Absolute: 1.5 10*3/uL (ref 0.7–3.1)
Lymphs: 16 %
MCH: 28.6 pg (ref 26.6–33.0)
MCHC: 34.3 g/dL (ref 31.5–35.7)
MCV: 83 fL (ref 79–97)
Monocytes Absolute: 0.6 10*3/uL (ref 0.1–0.9)
Monocytes: 7 %
Neutrophils Absolute: 6.9 10*3/uL (ref 1.4–7.0)
Neutrophils: 74 %
Platelets: 386 10*3/uL (ref 150–450)
RBC: 5.11 x10E6/uL (ref 3.77–5.28)
RDW: 13.4 % (ref 11.7–15.4)
WBC: 9.3 10*3/uL (ref 3.4–10.8)

## 2020-04-26 LAB — CMP14+EGFR
ALT: 18 IU/L (ref 0–32)
AST: 15 IU/L (ref 0–40)
Albumin/Globulin Ratio: 1.2 (ref 1.2–2.2)
Albumin: 4 g/dL (ref 3.8–4.8)
Alkaline Phosphatase: 84 IU/L (ref 48–121)
BUN/Creatinine Ratio: 13 (ref 9–23)
BUN: 10 mg/dL (ref 6–20)
Bilirubin Total: 0.5 mg/dL (ref 0.0–1.2)
CO2: 26 mmol/L (ref 20–29)
Calcium: 9.5 mg/dL (ref 8.7–10.2)
Chloride: 100 mmol/L (ref 96–106)
Creatinine, Ser: 0.76 mg/dL (ref 0.57–1.00)
GFR calc Af Amer: 117 mL/min/{1.73_m2} (ref >59)
GFR calc non Af Amer: 101 mL/min/{1.73_m2} (ref >59)
Globulin, Total: 3.3 g/dL (ref 1.5–4.5)
Glucose: 86 mg/dL (ref 65–99)
Potassium: 3.9 mmol/L (ref 3.5–5.2)
Sodium: 139 mmol/L (ref 134–144)
Total Protein: 7.3 g/dL (ref 6.0–8.5)

## 2020-04-26 LAB — TSH: TSH: 1.24 u[IU]/mL (ref 0.450–4.500)

## 2020-04-26 LAB — LIPID PANEL
Chol/HDL Ratio: 3.8 ratio (ref 0.0–4.4)
Cholesterol, Total: 165 mg/dL (ref 100–199)
HDL: 43 mg/dL (ref >39)
LDL Chol Calc (NIH): 102 mg/dL — ABNORMAL HIGH (ref 0–99)
Triglycerides: 112 mg/dL (ref 0–149)
VLDL Cholesterol Cal: 20 mg/dL (ref 5–40)

## 2020-04-26 LAB — HIV ANTIBODY (ROUTINE TESTING W REFLEX): HIV Screen 4th Generation wRfx: NONREACTIVE

## 2020-04-26 LAB — HEPATITIS C ANTIBODY: Hep C Virus Ab: 0.1 s/co ratio (ref 0.0–0.9)

## 2020-04-29 LAB — CYTOLOGY - PAP: Diagnosis: NEGATIVE

## 2020-05-29 ENCOUNTER — Telehealth (INDEPENDENT_AMBULATORY_CARE_PROVIDER_SITE_OTHER): Payer: 59 | Admitting: Family

## 2020-05-29 ENCOUNTER — Encounter: Payer: Self-pay | Admitting: Family

## 2020-05-29 DIAGNOSIS — J4531 Mild persistent asthma with (acute) exacerbation: Secondary | ICD-10-CM | POA: Diagnosis not present

## 2020-05-29 DIAGNOSIS — U071 COVID-19: Secondary | ICD-10-CM

## 2020-05-29 MED ORDER — ALBUTEROL SULFATE (2.5 MG/3ML) 0.083% IN NEBU: 2.5000 mg | INHALATION_SOLUTION | Freq: Four times a day (QID) | RESPIRATORY_TRACT | 1 refills | Status: DC | PRN

## 2020-05-29 MED ORDER — DEXAMETHASONE 6 MG PO TABS: 6.0000 mg | ORAL_TABLET | Freq: Every day | ORAL | 0 refills | Status: DC

## 2020-05-29 NOTE — Progress Notes (Signed)
Virtual Visit via telephone Note Due to COVID-19 pandemic this visit was conducted virtually. This visit type was conducted due to national recommendations for restrictions regarding the COVID-19 Pandemic (e.g. social distancing, sheltering in place) in an effort to limit this patient's exposure and mitigate transmission in our community. All issues noted in this document were discussed and addressed.  A physical exam was not performed with this format.  I connected with Jacqueline Lowery on 05/29/20 at 2:00 pm by telephone and verified that I am speaking with the correct person using two identifiers. Jacqueline Lowery is currently located at home and no one is currently with her during visit. The provider, Jannifer Rodney, FNP is located in their office at time of visit.  I discussed the limitations, risks, security and privacy concerns of performing an evaluation and management service by telephone and the availability of in person appointments. I also discussed with the patient that there may be a patient responsible charge related to this service. The patient expressed understanding and agreed to proceed.   History and Present Illness:  Pt calls the office today with COVID. She was tested today and was positive. She states her coughing and wheezing started Sunday morning and has worsen. She has asthma.  Cough This is a new problem. The current episode started in the past 7 days. The problem occurs every few minutes. The cough is non-productive. Associated symptoms include headaches, nasal congestion and wheezing. Pertinent negatives include no chills, ear congestion, ear pain, fever, myalgias, sore throat or shortness of breath. The symptoms are aggravated by lying down. She has tried OTC inhaler for the symptoms. The treatment provided mild relief.      Review of Systems  Constitutional: Negative for chills and fever.  HENT: Negative for ear pain and sore throat.   Respiratory: Positive for  cough and wheezing. Negative for shortness of breath.   Musculoskeletal: Negative for myalgias.  Neurological: Positive for headaches.     Observations/Objective: No SOB or distress noted, coughing  Assessment and Plan: Jacqueline Lowery comes in today with chief complaint of No chief complaint on file.   1. COVID-19 virus detected - dexamethasone (DECADRON) 6 MG tablet; Take 1 tablet (6 mg total) by mouth daily.  Dispense: 10 tablet; Refill: 0 - albuterol (PROVENTIL) (2.5 MG/3ML) 0.083% nebulizer solution; Take 3 mLs (2.5 mg total) by nebulization every 6 (six) hours as needed for wheezing or shortness of breath.  Dispense: 150 mL; Refill: 1  2. Severe obesity (BMI >= 40) (HCC) - dexamethasone (DECADRON) 6 MG tablet; Take 1 tablet (6 mg total) by mouth daily.  Dispense: 10 tablet; Refill: 0 - albuterol (PROVENTIL) (2.5 MG/3ML) 0.083% nebulizer solution; Take 3 mLs (2.5 mg total) by nebulization every 6 (six) hours as needed for wheezing or shortness of breath.  Dispense: 150 mL; Refill: 1  3. Mild persistent asthma with acute exacerbation - dexamethasone (DECADRON) 6 MG tablet; Take 1 tablet (6 mg total) by mouth daily.  Dispense: 10 tablet; Refill: 0 - albuterol (PROVENTIL) (2.5 MG/3ML) 0.083% nebulizer solution; Take 3 mLs (2.5 mg total) by nebulization every 6 (six) hours as needed for wheezing or shortness of breath.  Dispense: 150 mL; Refill: 1  Start dexamethasone today Referral for MAB infusion  If SOB worsens go to ED    I discussed the assessment and treatment plan with the patient. The patient was provided an opportunity to ask questions and all were answered. The patient agreed with the plan  and demonstrated an understanding of the instructions.   The patient was advised to call back or seek an in-person evaluation if the symptoms worsen or if the condition fails to improve as anticipated.  The above assessment and management plan was discussed with the patient. The patient  verbalized understanding of and has agreed to the management plan. Patient is aware to call the clinic if symptoms persist or worsen. Patient is aware when to return to the clinic for a follow-up visit. Patient educated on when it is appropriate to go to the emergency department.   Time call ended:  2:17 pm  I provided 17 minutes of non-face-to-face time during this encounter.    Jannifer Rodney, FNP

## 2020-05-30 ENCOUNTER — Other Ambulatory Visit: Payer: Self-pay | Admitting: Oncology

## 2020-05-30 ENCOUNTER — Encounter: Payer: Self-pay | Admitting: Oncology

## 2020-05-30 DIAGNOSIS — U071 COVID-19: Secondary | ICD-10-CM

## 2020-05-30 NOTE — Progress Notes (Signed)
I connected by phone with  Jacqueline Lowery to discuss the potential use of an new treatment for mild to moderate COVID-19 viral infection in non-hospitalized patients.   This patient is a age/sex that meets the FDA criteria for Emergency Use Authorization of casirivimab\imdevimab.  Has a (+) direct SARS-CoV-2 viral test result 1. Has mild or moderate COVID-19  2. Is ? 37 years of age and weighs ? 40 kg 3. Is NOT hospitalized due to COVID-19 4. Is NOT requiring oxygen therapy or requiring an increase in baseline oxygen flow rate due to COVID-19 5. Is within 10 days of symptom onset 6. Has at least one of the high risk factor(s) for progression to severe COVID-19 and/or hospitalization as defined in EUA. Specific high risk criteria : Past Medical History:  Diagnosis Date  . Depression   . Hypertension   . Tachycardia   ?  ?    Symptom onset  05/27/20   I have spoken and communicated the following to the patient or parent/caregiver:   1. FDA has authorized the emergency use of casirivimab\imdevimab for the treatment of mild to moderate COVID-19 in adults and pediatric patients with positive results of direct SARS-CoV-2 viral testing who are 57 years of age and older weighing at least 40 kg, and who are at high risk for progressing to severe COVID-19 and/or hospitalization.   2. The significant known and potential risks and benefits of casirivimab\imdevimab, and the extent to which such potential risks and benefits are unknown.   3. Information on available alternative treatments and the risks and benefits of those alternatives, including clinical trials.   4. Patients treated with casirivimab\imdevimab should continue to self-isolate and use infection control measures (e.g., wear mask, isolate, social distance, avoid sharing personal items, clean and disinfect "high touch" surfaces, and frequent handwashing) according to CDC guidelines.    5. The patient or parent/caregiver has the option to  accept or refuse casirivimab\imdevimab .   After reviewing this information with the patient, The patient agreed to proceed with receiving casirivimab\imdevimab infusion and will be provided a copy of the Fact sheet prior to receiving the infusion.Mignon Pine, AGNP-C [phone] (Infusion Center Hotline)

## 2020-05-31 ENCOUNTER — Ambulatory Visit (HOSPITAL_COMMUNITY)
Admission: RE | Admit: 2020-05-31 | Discharge: 2020-05-31 | Disposition: A | Discharge status: Home / Self Care | Payer: 59 | Source: Ambulatory Visit | Attending: Pulmonary Disease | Admitting: Pulmonary Disease

## 2020-05-31 ENCOUNTER — Other Ambulatory Visit (HOSPITAL_COMMUNITY): Payer: Self-pay

## 2020-05-31 DIAGNOSIS — U071 COVID-19: Secondary | ICD-10-CM | POA: Diagnosis not present

## 2020-05-31 MED ORDER — EPINEPHRINE 0.3 MG/0.3ML IJ SOAJ: 0.3000 mg | Freq: Once | INTRAMUSCULAR | Status: DC | PRN

## 2020-05-31 MED ORDER — SODIUM CHLORIDE 0.9 % IV SOLN: INTRAVENOUS | Status: DC | PRN

## 2020-05-31 MED ORDER — DIPHENHYDRAMINE HCL 50 MG/ML IJ SOLN: 50.0000 mg | Freq: Once | INTRAMUSCULAR | Status: DC | PRN

## 2020-05-31 MED ORDER — METHYLPREDNISOLONE SODIUM SUCC 125 MG IJ SOLR: 125.0000 mg | Freq: Once | INTRAMUSCULAR | Status: DC | PRN

## 2020-05-31 MED ORDER — FAMOTIDINE IN NACL 20-0.9 MG/50ML-% IV SOLN: 20.0000 mg | Freq: Once | INTRAVENOUS | Status: DC | PRN

## 2020-05-31 MED ORDER — ALBUTEROL SULFATE HFA 108 (90 BASE) MCG/ACT IN AERS: 2.0000 | INHALATION_SPRAY | Freq: Once | RESPIRATORY_TRACT | Status: DC | PRN

## 2020-05-31 MED ORDER — SODIUM CHLORIDE 0.9 % IV SOLN
1200.0000 mg | Freq: Once | INTRAVENOUS | Status: AC
  Administered 2020-05-31: 1200 mg via INTRAVENOUS

## 2020-05-31 NOTE — Discharge Instructions (Signed)
10 Things You Can Do to Manage Your COVID-19 Symptoms at Home If you have possible or confirmed COVID-19: 1. Stay home from work and school. And stay away from other public places. If you must go out, avoid using any kind of public transportation, ridesharing, or taxis. 2. Monitor your symptoms carefully. If your symptoms get worse, call your healthcare provider immediately. 3. Get rest and stay hydrated. 4. If you have a medical appointment, call the healthcare provider ahead of time and tell them that you have or may have COVID-19. 5. For medical emergencies, call 911 and notify the dispatch personnel that you have or may have COVID-19. 6. Cover your cough and sneezes with a tissue or use the inside of your elbow. 7. Wash your hands often with soap and water for at least 20 seconds or clean your hands with an alcohol-based hand sanitizer that contains at least 60% alcohol. 8. As much as possible, stay in a specific room and away from other people in your home. Also, you should use a separate bathroom, if available. If you need to be around other people in or outside of the home, wear a mask. 9. Avoid sharing personal items with other people in your household, like dishes, towels, and bedding. 10. Clean all surfaces that are touched often, like counters, tabletops, and doorknobs. Use household cleaning sprays or wipes according to the label instructions. cdc.gov/coronavirus 03/08/2019 This information is not intended to replace advice given to you by your health care provider. Make sure you discuss any questions you have with your health care provider. Document Revised: 08/10/2019 Document Reviewed: 08/10/2019 Elsevier Patient Education  2020 [employer] 10 Things You Can Do to Manage Your COVID-19 Symptoms at Home If you have possible or confirmed COVID-19: 11. Stay home from work and school. And stay away from other public places. If you must go out, avoid using any kind of public  transportation, ridesharing, or taxis. 12. Monitor your symptoms carefully. If your symptoms get worse, call your healthcare provider immediately. 13. Get rest and stay hydrated. 14. If you have a medical appointment, call the healthcare provider ahead of time and tell them that you have or may have COVID-19. 15. For medical emergencies, call 911 and notify the dispatch personnel that you have or may have COVID-19. 16. Cover your cough and sneezes with a tissue or use the inside of your elbow. 17. Wash your hands often with soap and water for at least 20 seconds or clean your hands with an alcohol-based hand sanitizer that contains at least 60% alcohol. 18. As much as possible, stay in a specific room and away from other people in your home. Also, you should use a separate bathroom, if available. If you need to be around other people in or outside of the home, wear a mask. 19. Avoid sharing personal items with other people in your household, like dishes, towels, and bedding. 20. Clean all surfaces that are touched often, like counters, tabletops, and doorknobs. Use household cleaning sprays or wipes according to the label instructions. cdc.gov/coronavirus 03/08/2019 This information is not intended to replace advice given to you by your health care provider. Make sure you discuss any questions you have with your health care provider. Document Revised: 08/10/2019 Document Reviewed: 08/10/2019 Elsevier Patient Education  2020 [employer] Inc[employer] What types of side effects do monoclonal antibody drugs cause?  Common side effects  In general, the more common side effects caused by monoclonal antibody drugs include: . Allergic reactions,   such as hives or itching . Flu-like signs and symptoms, including chills, fatigue, fever, and muscle aches and pains . Nausea, vomiting . Diarrhea . Skin rashes . Low blood pressure   The CDC is recommending patients who receive monoclonal antibody treatments wait  at least 90 days before being vaccinated.  Currently, there are no data on the safety and efficacy of mRNA COVID-19 vaccines in persons who received monoclonal antibodies or convalescent plasma as part of COVID-19 treatment. Based on the estimated half-life of such therapies as well as evidence suggesting that reinfection is uncommon in the 90 days after initial infection, vaccination should be deferred for at least 90 days, as a precautionary measure until additional information becomes available, to avoid interference of the antibody treatment with vaccine-induced immune responses. 

## 2020-05-31 NOTE — Progress Notes (Signed)
  Diagnosis: COVID-19  Physician: Patrick Wright, MD  Procedure: [facility] Med: casirivimab\imdevimab infusion - Provided patient with casirivimab\imdevimab fact sheet for patients, parents and caregivers prior to infusion.  Complications: No immediate complications noted.  Discharge: Discharged home   Adeja Sarratt N Jaquilla Woodroof 05/31/2020   

## 2020-06-10 ENCOUNTER — Other Ambulatory Visit: Payer: Self-pay | Admitting: Family

## 2020-06-10 MED ORDER — NORGESTIMATE-ETH ESTRADIOL 0.25-35 MG-MCG PO TABS: 1.0000 | ORAL_TABLET | Freq: Every day | ORAL | 3 refills | Status: DC

## 2020-06-10 NOTE — Addendum Note (Signed)
Addended by: Julious Payer D on: 06/10/2020 03:50 PM   Modules accepted: Orders

## 2020-07-21 ENCOUNTER — Other Ambulatory Visit: Payer: Self-pay | Admitting: Family

## 2020-07-21 DIAGNOSIS — J4531 Mild persistent asthma with (acute) exacerbation: Secondary | ICD-10-CM

## 2020-08-26 ENCOUNTER — Other Ambulatory Visit: Payer: Self-pay | Admitting: Family

## 2020-08-26 DIAGNOSIS — I10 Essential (primary) hypertension: Secondary | ICD-10-CM

## 2020-09-20 ENCOUNTER — Telehealth: Payer: 59 | Admitting: Family

## 2020-09-20 ENCOUNTER — Other Ambulatory Visit: Payer: Self-pay | Admitting: Family

## 2020-09-20 DIAGNOSIS — J069 Acute upper respiratory infection, unspecified: Secondary | ICD-10-CM

## 2020-09-20 DIAGNOSIS — J4531 Mild persistent asthma with (acute) exacerbation: Secondary | ICD-10-CM

## 2020-09-20 MED ORDER — BENZONATATE 200 MG PO CAPS: 200.0000 mg | ORAL_CAPSULE | Freq: Two times a day (BID) | ORAL | 0 refills | Status: DC | PRN

## 2020-09-20 MED ORDER — FLUTICASONE PROPIONATE 50 MCG/ACT NA SUSP: NASAL | 1 refills | Status: DC

## 2020-09-20 MED ORDER — PREDNISONE 10 MG (21) PO TBPK: ORAL_TABLET | ORAL | 0 refills | Status: DC

## 2020-09-20 NOTE — Addendum Note (Signed)
Addended by: Worthy Rancher B on: 09/20/2020 03:01 PM   Modules accepted: Orders

## 2020-09-20 NOTE — Addendum Note (Signed)
Addended by: Julious Payer D on: 09/20/2020 11:00 AM   Modules accepted: Orders

## 2020-09-20 NOTE — Progress Notes (Signed)
We are sorry you are not feeling well.  Here is how we plan to help!  Based on what you have shared with me, it looks like you may have a viral upper respiratory infection.  Upper respiratory infections are caused by a large number of viruses; however, rhinovirus is the most common cause.   Symptoms vary from person to person, with common symptoms including sore throat, cough, fatigue or lack of energy and feeling of general discomfort.  A low-grade fever of up to 100.4 may present, but is often uncommon.  Symptoms vary however, and are closely related to a person's age or underlying illnesses.  The most common symptoms associated with an upper respiratory infection are nasal discharge or congestion, cough, sneezing, headache and pressure in the ears and face.  These symptoms usually persist for about 3 to 10 days, but can last up to 2 weeks.  It is important to know that upper respiratory infections do not cause serious illness or complications in most cases.    Upper respiratory infections can be transmitted from person to person, with the most common method of transmission being a person's hands.  The virus is able to live on the skin and can infect other persons for up to 2 hours after direct contact.  Also, these can be transmitted when someone coughs or sneezes; thus, it is important to cover the mouth to reduce this risk.  To keep the spread of the illness at bay, good hand hygiene is very important.  This is an infection that is most likely caused by a virus. There are no specific treatments other than to help you with the symptoms until the infection runs its course.  We are sorry you are not feeling well.  Here is how we plan to help!   For nasal congestion, you may use an oral decongestants such as Mucinex D or if you have glaucoma or high blood pressure use plain Mucinex.  Saline nasal spray or nasal drops can help and can safely be used as often as needed for congestion.  For your congestion,  I have prescribed Fluticasone nasal spray one spray in each nostril twice a day  If you do not have a history of heart disease, hypertension, diabetes or thyroid disease, prostate/bladder issues or glaucoma, you may also use Sudafed to treat nasal congestion.  It is highly recommended that you consult with a pharmacist or your primary care physician to ensure this medication is safe for you to take.     If you have a cough, you may use cough suppressants such as Delsym and Robitussin.  If you have glaucoma or high blood pressure, you can also use Coricidin HBP.   For cough I have prescribed for you A prescription cough medication called Tessalon Perles 100 mg. You may take 1-2 capsules every 8 hours as needed for cough  If you have a sore or scratchy throat, use a saltwater gargle-  to  teaspoon of salt dissolved in a 4-ounce to 8-ounce glass of warm water.  Gargle the solution for approximately 15-30 seconds and then spit.  It is important not to swallow the solution.  You can also use throat lozenges/cough drops and Chloraseptic spray to help with throat pain or discomfort.  Warm or cold liquids can also be helpful in relieving throat pain.  For headache, pain or general discomfort, you can use Ibuprofen or Tylenol as directed.   Some authorities believe that zinc sprays or the use of   Echinacea may shorten the course of your symptoms.   HOME CARE . Only take medications as instructed by your medical team. . Be sure to drink plenty of fluids. Water is fine as well as fruit juices, sodas and electrolyte beverages. You may want to stay away from caffeine or alcohol. If you are nauseated, try taking small sips of liquids. How do you know if you are getting enough fluid? Your urine should be a pale yellow or almost colorless. . Get rest. . Taking a steamy shower or using a humidifier may help nasal congestion and ease sore throat pain. You can place a towel over your head and breathe in the steam  from hot water coming from a faucet. . Using a saline nasal spray works much the same way. . Cough drops, hard candies and sore throat lozenges may ease your cough. . Avoid close contacts especially the very young and the elderly . Cover your mouth if you cough or sneeze . Always remember to wash your hands.   GET HELP RIGHT AWAY IF: . You develop worsening fever. . If your symptoms do not improve within 10 days . You develop yellow or green discharge from your nose over 3 days. . You have coughing fits . You develop a severe head ache or visual changes. . You develop shortness of breath, difficulty breathing or start having chest pain . Your symptoms persist after you have completed your treatment plan  MAKE SURE YOU   Understand these instructions.  Will watch your condition.  Will get help right away if you are not doing well or get worse.  Your e-visit answers were reviewed by a board certified advanced clinical practitioner to complete your personal care plan. Depending upon the condition, your plan could have included both over the counter or prescription medications. Please review your pharmacy choice. If there is a problem, you may call our nursing hot line at and have the prescription routed to another pharmacy. Your safety is important to us. If you have drug allergies check your prescription carefully.   You can use MyChart to ask questions about today's visit, request a non-urgent call back, or ask for a work or school excuse for 24 hours related to this e-Visit. If it has been greater than 24 hours you will need to follow up with your provider, or enter a new e-Visit to address those concerns. You will get an e-mail in the next two days asking about your experience.  I hope that your e-visit has been valuable and will speed your recovery. Thank you for using e-visits.     Greater than 5 minutes, yet less than 10 minutes of time have been spent researching, coordinating,  and implementing care for this patient today.  Thank you for the details you included in the comment boxes. Those details are very helpful in determining the best course of treatment for you and help us to provide the best care.  

## 2020-11-21 ENCOUNTER — Other Ambulatory Visit: Payer: Self-pay | Admitting: Family

## 2020-11-21 DIAGNOSIS — I10 Essential (primary) hypertension: Secondary | ICD-10-CM

## 2021-01-09 ENCOUNTER — Other Ambulatory Visit: Payer: Self-pay | Admitting: Family

## 2021-01-09 DIAGNOSIS — K219 Gastro-esophageal reflux disease without esophagitis: Secondary | ICD-10-CM

## 2021-02-07 ENCOUNTER — Telehealth: Payer: Self-pay | Admitting: Family

## 2021-02-07 DIAGNOSIS — I10 Essential (primary) hypertension: Secondary | ICD-10-CM

## 2021-02-07 MED ORDER — LOSARTAN POTASSIUM 50 MG PO TABS: 50.0000 mg | ORAL_TABLET | Freq: Every day | ORAL | 0 refills | Status: DC

## 2021-02-07 NOTE — Telephone Encounter (Signed)
Pt aware refill sent to pharmacy 

## 2021-02-07 NOTE — Telephone Encounter (Signed)
  Prescription Request  02/07/2021  What is the name of the medication or equipment? Losartan  Have you contacted your pharmacy to request a refill? (if applicable) Yes  Which pharmacy would you like this sent to? [facility]  Pt scheduled appt to see Neysa Bonito on 6/29 for med refill but is completely out of her medicine and needs [facility] [place] [facility]AP.

## 2021-02-10 ENCOUNTER — Other Ambulatory Visit: Payer: Self-pay | Admitting: Family

## 2021-02-10 DIAGNOSIS — K219 Gastro-esophageal reflux disease without esophagitis: Secondary | ICD-10-CM

## 2021-02-27 ENCOUNTER — Telehealth: Payer: Self-pay | Admitting: Family

## 2021-02-27 NOTE — Telephone Encounter (Signed)
  Incoming Patient Call  02/27/2021  What symptoms do you have? Red bumps that started after getting sunburnt over the weekend, they are not going away  How long have you been sick? Since the beginning of the week  Have you been seen for this problem? no  If your provider decides to give you a prescription, which pharmacy would you like for it to be sent to? [facility]   *pt is going on vacation next week and wants to make sure this is nothing to worry about, when she mentioned to her pharmacist they told her that it may be because of her blood pressure med or her fluid med.    Patient informed that this information will be sent to the clinical staff for review and that they should receive a follow up call.

## 2021-02-27 NOTE — Telephone Encounter (Signed)
I spoke with pt and she states she was at a ball tournament this weekend and was in direct sunlight and didn't wear susnscreen as it was earlier in the day and she wasn't under the tent and she did get sun and has some red spots. She was wondering if her losartan with hctz was causing her to be more sensitive to the sun but she has been on these medications for a while. Advised pt she should wear sunscreen and stay in the shade and monitor-if continues to happen to call back for appt.

## 2021-03-05 ENCOUNTER — Ambulatory Visit: Payer: Self-pay | Admitting: Family

## 2021-03-05 ENCOUNTER — Other Ambulatory Visit: Payer: Self-pay | Admitting: Family

## 2021-03-05 DIAGNOSIS — I10 Essential (primary) hypertension: Secondary | ICD-10-CM

## 2021-03-09 ENCOUNTER — Other Ambulatory Visit: Payer: Self-pay | Admitting: Family

## 2021-03-09 DIAGNOSIS — J4531 Mild persistent asthma with (acute) exacerbation: Secondary | ICD-10-CM

## 2021-03-14 ENCOUNTER — Other Ambulatory Visit: Payer: Self-pay | Admitting: Family

## 2021-03-14 DIAGNOSIS — K219 Gastro-esophageal reflux disease without esophagitis: Secondary | ICD-10-CM

## 2021-03-17 ENCOUNTER — Encounter: Payer: Self-pay | Admitting: Family

## 2021-03-17 ENCOUNTER — Other Ambulatory Visit: Payer: Self-pay

## 2021-03-17 ENCOUNTER — Ambulatory Visit (INDEPENDENT_AMBULATORY_CARE_PROVIDER_SITE_OTHER): Payer: BC Managed Care – PPO | Admitting: Family

## 2021-03-17 VITALS — BP 130/86 | HR 89 | Temp 97.8°F | Ht 64.0 in | Wt 272.4 lb

## 2021-03-17 DIAGNOSIS — Z0001 Encounter for general adult medical examination with abnormal findings: Secondary | ICD-10-CM

## 2021-03-17 DIAGNOSIS — R21 Rash and other nonspecific skin eruption: Secondary | ICD-10-CM | POA: Diagnosis not present

## 2021-03-17 DIAGNOSIS — I10 Essential (primary) hypertension: Secondary | ICD-10-CM

## 2021-03-17 DIAGNOSIS — Z0000 Encounter for general adult medical examination without abnormal findings: Secondary | ICD-10-CM | POA: Diagnosis not present

## 2021-03-17 DIAGNOSIS — J4531 Mild persistent asthma with (acute) exacerbation: Secondary | ICD-10-CM | POA: Diagnosis not present

## 2021-03-17 DIAGNOSIS — K219 Gastro-esophageal reflux disease without esophagitis: Secondary | ICD-10-CM | POA: Diagnosis not present

## 2021-03-17 DIAGNOSIS — F411 Generalized anxiety disorder: Secondary | ICD-10-CM

## 2021-03-17 MED ORDER — MONTELUKAST SODIUM 10 MG PO TABS: 10.0000 mg | ORAL_TABLET | Freq: Every day | ORAL | 2 refills | Status: DC

## 2021-03-17 MED ORDER — NORGESTIMATE-ETH ESTRADIOL 0.25-35 MG-MCG PO TABS: 1.0000 | ORAL_TABLET | Freq: Every day | ORAL | 3 refills | Status: DC

## 2021-03-17 MED ORDER — ALBUTEROL SULFATE HFA 108 (90 BASE) MCG/ACT IN AERS: INHALATION_SPRAY | RESPIRATORY_TRACT | 2 refills | Status: DC

## 2021-03-17 MED ORDER — OMEPRAZOLE 20 MG PO CPDR: DELAYED_RELEASE_CAPSULE | ORAL | 2 refills | Status: DC

## 2021-03-17 MED ORDER — HYDROXYZINE PAMOATE 25 MG PO CAPS: 25.0000 mg | ORAL_CAPSULE | Freq: Three times a day (TID) | ORAL | 1 refills | Status: DC | PRN

## 2021-03-17 MED ORDER — LOSARTAN POTASSIUM 50 MG PO TABS: 50.0000 mg | ORAL_TABLET | Freq: Every day | ORAL | 1 refills | Status: DC

## 2021-03-17 MED ORDER — HYDROCHLOROTHIAZIDE 12.5 MG PO CAPS: ORAL_CAPSULE | ORAL | 2 refills | Status: DC

## 2021-03-17 NOTE — Progress Notes (Signed)
Subjective:    Patient ID: Jacqueline Lowery, female    DOB: March 12, 1981, 40 y.o.   MRN: [id number]  Chief Complaint  Patient presents with   Medical Management of Chronic Issues   PT presents to the office today for CPE without pap. She reports she has been out of medication for 5 days.  Hypertension This is a chronic problem. The current episode started more than 1 year ago. The problem has been waxing and waning since onset. The problem is uncontrolled. Associated symptoms include anxiety. Pertinent negatives include no malaise/fatigue, peripheral edema or shortness of breath. Risk factors for coronary artery disease include dyslipidemia, obesity and sedentary lifestyle. The current treatment provides moderate improvement.  Asthma There is no cough, shortness of breath or wheezing. The current episode started more than 1 year ago. Pertinent negatives include no malaise/fatigue. Her symptoms are aggravated by pollen. Her symptoms are alleviated by leukotriene antagonist. She reports moderate improvement on treatment. Her past medical history is significant for asthma.  Anxiety Presents for follow-up visit. Symptoms include depressed mood, excessive worry, nervous/anxious behavior and restlessness. Patient reports no shortness of breath. Symptoms occur occasionally. The severity of symptoms is moderate.   Her past medical history is significant for asthma.  Rash This is a new problem. The current episode started 1 to 4 weeks ago. The problem has been resolved since onset. The affected locations include the right hand, left arm, left hand, left wrist and right wrist. The rash is characterized by redness and itchiness. Pertinent negatives include no cough or shortness of breath. Past treatments include antihistamine. The treatment provided moderate relief. Her past medical history is significant for asthma.     Review of Systems  Constitutional:  Negative for malaise/fatigue.  Respiratory:   Negative for cough, shortness of breath and wheezing.   Skin:  Positive for rash.  Psychiatric/Behavioral:  The patient is nervous/anxious.   All other systems reviewed and are negative.  Family History  Problem Relation Age of Onset   Hypertension Mother    Hypertension Father    Heart disease Maternal Grandmother    Social History   Socioeconomic History   Marital status: Married    Spouse name: Not on file   Number of children: Not on file   Years of education: Not on file   Highest education level: Not on file  Occupational History   Not on file  Tobacco Use   Smoking status: Never   Smokeless tobacco: Never  Vaping Use   Vaping Use: Never used  Substance and Sexual Activity   Alcohol use: No   Drug use: No   Sexual activity: Not on file  Other Topics Concern   Not on file  Social History Narrative   Not on file   Social Determinants of Health   Financial Resource Strain: Not on file  Food Insecurity: Not on file  Transportation Needs: Not on file  Physical Activity: Not on file  Stress: Not on file  Social Connections: Not on file       Objective:   Physical Exam Vitals reviewed.  Constitutional:      General: She is not in acute distress.    Appearance: She is well-developed. She is obese.  HENT:     Head: Normocephalic and atraumatic.     Right Ear: Tympanic membrane normal.     Left Ear: Tympanic membrane normal.  Eyes:     Pupils: Pupils are equal, round, and reactive to light.  Neck:     Thyroid: No thyromegaly.  Cardiovascular:     Rate and Rhythm: Normal rate and regular rhythm.     Heart sounds: Normal heart sounds. No murmur heard. Pulmonary:     Effort: Pulmonary effort is normal. No respiratory distress.     Breath sounds: Normal breath sounds. No wheezing.  Abdominal:     General: Bowel sounds are normal. There is no distension.     Palpations: Abdomen is soft.     Tenderness: There is no abdominal tenderness.  Musculoskeletal:         General: No tenderness. Normal range of motion.     Cervical back: Normal range of motion and neck supple.  Skin:    General: Skin is warm and dry.  Neurological:     Mental Status: She is alert and oriented to person, place, and time.     Cranial Nerves: No cranial nerve deficit.     Deep Tendon Reflexes: Reflexes are normal and symmetric.  Psychiatric:        Behavior: Behavior normal.        Thought Content: Thought content normal.        Judgment: Judgment normal.      BP 130/86   Pulse 89   Temp 97.8 F (36.6 C) (Temporal)   Ht 5' 4"  (1.626 m)   Wt 272 lb 6.4 oz (123.6 kg)   BMI 46.76 kg/m      Assessment & Plan:  Jacqueline Lowery comes in today with chief complaint of Medical Management of Chronic Issues   Diagnosis and orders addressed:  1. Essential hypertension - hydrochlorothiazide (MICROZIDE) 12.5 MG capsule; TAKE 1 CAPSULE BY MOUTH EVERY DAY NTBS before another refill  Dispense: 90 capsule; Refill: 2 - losartan (COZAAR) 50 MG tablet; Take 1 tablet (50 mg total) by mouth daily.  Dispense: 90 tablet; Refill: 1 - CMP14+EGFR - CBC with Differential/Platelet  2. Gastroesophageal reflux disease, unspecified whether esophagitis present - omeprazole (PRILOSEC) 20 MG capsule; TAKE (1) CAPSULE DAILY  Dispense: 90 capsule; Refill: 2 - CMP14+EGFR - CBC with Differential/Platelet  3. Mild persistent asthma with acute exacerbation - montelukast (SINGULAIR) 10 MG tablet; Take 1 tablet (10 mg total) by mouth at bedtime.  Dispense: 90 tablet; Refill: 2 - albuterol (VENTOLIN HFA) 108 (90 Base) MCG/ACT inhaler; 2 PUFFS EVERY 6 HOURS AS NEEDED FOR WHEEZING  Dispense: 8.5 g; Refill: 2 - CMP14+EGFR - CBC with Differential/Platelet  4. Annual physical exam - CMP14+EGFR - CBC with Differential/Platelet - Lipid panel - TSH  5. Severe obesity (BMI >= 40) (HCC) - CMP14+EGFR - CBC with Differential/Platelet  6. GAD (generalized anxiety disorder) - CMP14+EGFR - CBC with  Differential/Platelet  7. Rash and nonspecific skin eruption Start Vistaril 25 mg TID prn Avoid allergens  - hydrOXYzine (VISTARIL) 25 MG capsule; Take 1 capsule (25 mg total) by mouth 3 (three) times daily as needed.  Dispense: 90 capsule; Refill: 1   Labs pending Health Maintenance reviewed Diet and exercise encouraged  Follow up plan: 1 month to recheck rash and skin tag removal    Evelina Dun, FNP

## 2021-03-17 NOTE — Patient Instructions (Signed)
Hives Hives (urticaria) are itchy, red, swollen areas on the skin. Hives can appear on any part of the body. Hives often fade within 24 hours (acute hives). Sometimes, new hives appear after old ones fade and the cycle can continue for several days or weeks (chronic hives). Hives do not spread from person to person (are not contagious). Hives come from the body's reaction to something a person is allergic to (allergen), something that causes irritation, or various other triggers. When a person is exposed to a trigger, his or her body releases a chemical (histamine) that causes redness, itching, and swelling. Hives can appear right afterexposure to a trigger or hours later. What are the causes? This condition may be caused by: Allergies to foods or ingredients. Insect bites or stings. Exposure to pollen or pets. Contact with latex or chemicals. Spending time in sunlight, heat, or cold (exposure). Exercise. Stress. Certain medicines. You can also get hives from other medical conditions and treatments, such as: Viruses, including the common cold. Bacterial infections, such as urinary tract infections and strep throat. Certain medicines. Allergy shots. Blood transfusions. Sometimes, the cause of this condition is not known (idiopathic hives). What increases the risk? You are more likely to develop this condition if you: Are a woman. Have food allergies, especially to citrus fruits, milk, eggs, peanuts, tree nuts, or shellfish. Are allergic to: Medicines. Latex. Insects. Animals. Pollen. What are the signs or symptoms? Common symptoms of this condition include raised, itchy, red or white bumps or patches on your skin. These areas may: Become large and swollen (welts). Change in shape and location, quickly and repeatedly. Be separate hives or connect over a large area of skin. Sting or become painful. Turn white when pressed in the center (blanch). In severe cases, your hands, feet,  and face may also become swollen. This mayoccur if hives develop deeper in your skin. How is this diagnosed? This condition may be diagnosed by your symptoms, medical history, and physical exam. Your skin, urine, or blood may be tested to find out what is causing your hives and to rule out other health issues. Your health care provider may also remove a small sample of skin from the affected area and examine it under a microscope (biopsy). How is this treated? Treatment for this condition depends on the cause and severity of your symptoms. Your health care provider may recommend using cool, wet cloths (cool compresses) or taking cool showers to relieve itching. Treatment may include: Medicines that help: Relieve itching (antihistamines). Reduce swelling (corticosteroids). Treat infection (antibiotics). An injectable medicine (omalizumab). Your health care provider may prescribe this if you have chronic idiopathic hives and you continue to have symptoms even after treatment with antihistamines. Severe cases may require an emergency injection of adrenaline (epinephrine) to prevent a life-threatening allergic reaction (anaphylaxis). Follow these instructions at home: Medicines Take and apply over-the-counter and prescription medicines only as told by your health care provider. If you were prescribed an antibiotic medicine, take it as told by your health care provider. Do not stop using the antibiotic even if you start to feel better. Skin care Apply cool compresses to the affected areas. Do not scratch or rub your skin. General instructions Do not take hot showers or baths. This can make itching worse. Do not wear tight-fitting clothing. Use sunscreen and wear protective clothing when you are outside. Avoid any substances that cause your hives. Keep a journal to help track what causes your hives. Write down: What medicines you take.  What you eat and drink. What products you use on your  skin. Keep all follow-up visits as told by your health care provider. This is important. Contact a health care provider if: Your symptoms are not controlled with medicine. Your joints are painful or swollen. Get help right away if: You have a fever. You have pain in your abdomen. Your tongue or lips are swollen. Your eyelids are swollen. Your chest or throat feels tight. You have trouble breathing or swallowing. These symptoms may represent a serious problem that is an emergency. Do not wait to see if the symptoms will go away. Get medical help right away. Call your local emergency services (911 in the [place] Do not drive yourself to the hospital. Summary Hives (urticaria) are itchy, red, swollen areas on your skin. Hives come from the body's reaction to something a person is allergic to (allergen), something that causes irritation, or various other triggers. Treatment for this condition depends on the cause and severity of your symptoms. Avoid any substances that cause your hives. Keep a journal to help track what causes your hives. Take and apply over-the-counter and prescription medicines only as told by your health care provider. Keep all follow-up visits as told by your health care provider. This is important. This information is not intended to replace advice given to you by your health care provider. Make sure you discuss any questions you have with your healthcare provider. Document Revised: 03/09/2018 Document Reviewed: 03/09/2018 Elsevier Patient Education  2022 [employer]  

## 2021-03-18 LAB — CMP14+EGFR
ALT: 15 IU/L (ref 0–32)
AST: 18 IU/L (ref 0–40)
Albumin/Globulin Ratio: 1.3 (ref 1.2–2.2)
Albumin: 4.3 g/dL (ref 3.8–4.8)
Alkaline Phosphatase: 77 IU/L (ref 44–121)
BUN/Creatinine Ratio: 15 (ref 9–23)
BUN: 13 mg/dL (ref 6–20)
Bilirubin Total: 0.3 mg/dL (ref 0.0–1.2)
CO2: 25 mmol/L (ref 20–29)
Calcium: 10 mg/dL (ref 8.7–10.2)
Chloride: 97 mmol/L (ref 96–106)
Creatinine, Ser: 0.88 mg/dL (ref 0.57–1.00)
Globulin, Total: 3.2 g/dL (ref 1.5–4.5)
Glucose: 106 mg/dL — ABNORMAL HIGH (ref 65–99)
Potassium: 4.2 mmol/L (ref 3.5–5.2)
Sodium: 139 mmol/L (ref 134–144)
Total Protein: 7.5 g/dL (ref 6.0–8.5)
eGFR: 87 mL/min/{1.73_m2} (ref >59)

## 2021-03-18 LAB — LIPID PANEL
Chol/HDL Ratio: 3.7 ratio (ref 0.0–4.4)
Cholesterol, Total: 179 mg/dL (ref 100–199)
HDL: 48 mg/dL (ref >39)
LDL Chol Calc (NIH): 93 mg/dL (ref 0–99)
Triglycerides: 226 mg/dL — ABNORMAL HIGH (ref 0–149)
VLDL Cholesterol Cal: 38 mg/dL (ref 5–40)

## 2021-03-18 LAB — CBC WITH DIFFERENTIAL/PLATELET
Basophils Absolute: 0.1 10*3/uL (ref 0.0–0.2)
Basos: 1 %
EOS (ABSOLUTE): 0.4 10*3/uL (ref 0.0–0.4)
Eos: 5 %
Hematocrit: 43.3 % (ref 34.0–46.6)
Hemoglobin: 14.8 g/dL (ref 11.1–15.9)
Immature Grans (Abs): 0 10*3/uL (ref 0.0–0.1)
Immature Granulocytes: 0 %
Lymphocytes Absolute: 2.1 10*3/uL (ref 0.7–3.1)
Lymphs: 27 %
MCH: 29.2 pg (ref 26.6–33.0)
MCHC: 34.2 g/dL (ref 31.5–35.7)
MCV: 86 fL (ref 79–97)
Monocytes Absolute: 0.7 10*3/uL (ref 0.1–0.9)
Monocytes: 9 %
Neutrophils Absolute: 4.6 10*3/uL (ref 1.4–7.0)
Neutrophils: 58 %
Platelets: 382 10*3/uL (ref 150–450)
RBC: 5.06 x10E6/uL (ref 3.77–5.28)
RDW: 13.3 % (ref 11.7–15.4)
WBC: 7.9 10*3/uL (ref 3.4–10.8)

## 2021-03-18 LAB — TSH: TSH: 3.89 u[IU]/mL (ref 0.450–4.500)

## 2021-03-24 ENCOUNTER — Other Ambulatory Visit: Payer: Self-pay | Admitting: Family

## 2021-03-24 MED ORDER — TRIAMCINOLONE ACETONIDE 0.5 % EX OINT: 1.0000 "application " | TOPICAL_OINTMENT | Freq: Two times a day (BID) | CUTANEOUS | 1 refills | Status: DC

## 2021-04-29 ENCOUNTER — Ambulatory Visit: Payer: BC Managed Care – PPO | Admitting: Family

## 2021-05-01 ENCOUNTER — Other Ambulatory Visit: Payer: Self-pay | Admitting: Family

## 2021-05-01 DIAGNOSIS — R000 Tachycardia, unspecified: Secondary | ICD-10-CM

## 2021-08-13 ENCOUNTER — Other Ambulatory Visit: Payer: Self-pay | Admitting: Family

## 2021-08-13 DIAGNOSIS — R21 Rash and other nonspecific skin eruption: Secondary | ICD-10-CM

## 2021-09-12 ENCOUNTER — Other Ambulatory Visit: Payer: Self-pay | Admitting: Family

## 2021-09-12 DIAGNOSIS — I10 Essential (primary) hypertension: Secondary | ICD-10-CM

## 2021-10-10 ENCOUNTER — Other Ambulatory Visit: Payer: Self-pay | Admitting: Family

## 2021-10-10 DIAGNOSIS — R21 Rash and other nonspecific skin eruption: Secondary | ICD-10-CM

## 2021-10-14 ENCOUNTER — Other Ambulatory Visit: Payer: Self-pay | Admitting: Family

## 2021-10-14 DIAGNOSIS — J4531 Mild persistent asthma with (acute) exacerbation: Secondary | ICD-10-CM

## 2021-10-14 DIAGNOSIS — U071 COVID-19: Secondary | ICD-10-CM

## 2021-12-13 ENCOUNTER — Other Ambulatory Visit: Payer: Self-pay | Admitting: Family

## 2021-12-13 DIAGNOSIS — I10 Essential (primary) hypertension: Secondary | ICD-10-CM

## 2021-12-17 ENCOUNTER — Other Ambulatory Visit: Payer: Self-pay | Admitting: Family

## 2021-12-17 DIAGNOSIS — K219 Gastro-esophageal reflux disease without esophagitis: Secondary | ICD-10-CM

## 2022-01-05 ENCOUNTER — Other Ambulatory Visit: Payer: Self-pay | Admitting: Family

## 2022-01-05 DIAGNOSIS — J4531 Mild persistent asthma with (acute) exacerbation: Secondary | ICD-10-CM

## 2022-01-05 DIAGNOSIS — I10 Essential (primary) hypertension: Secondary | ICD-10-CM

## 2022-01-05 DIAGNOSIS — K219 Gastro-esophageal reflux disease without esophagitis: Secondary | ICD-10-CM

## 2022-01-06 MED ORDER — LOSARTAN POTASSIUM 50 MG PO TABS: 50.0000 mg | ORAL_TABLET | Freq: Every day | ORAL | 0 refills | Status: DC

## 2022-01-06 MED ORDER — OMEPRAZOLE 20 MG PO CPDR: DELAYED_RELEASE_CAPSULE | ORAL | 0 refills | Status: DC

## 2022-01-06 MED ORDER — HYDROCHLOROTHIAZIDE 12.5 MG PO CAPS: ORAL_CAPSULE | ORAL | 0 refills | Status: DC

## 2022-01-06 MED ORDER — MONTELUKAST SODIUM 10 MG PO TABS: 10.0000 mg | ORAL_TABLET | Freq: Every day | ORAL | 0 refills | Status: DC

## 2022-01-06 NOTE — Addendum Note (Signed)
Addended by: Julious Payer D on: 01/06/2022 09:41 AM ? ? Modules accepted: Orders ? ?

## 2022-02-05 ENCOUNTER — Other Ambulatory Visit: Payer: Self-pay | Admitting: Family

## 2022-02-05 ENCOUNTER — Encounter: Payer: Self-pay | Admitting: Family

## 2022-02-05 ENCOUNTER — Ambulatory Visit (INDEPENDENT_AMBULATORY_CARE_PROVIDER_SITE_OTHER): Payer: BC Managed Care – PPO | Admitting: Family

## 2022-02-05 VITALS — BP 128/90 | HR 80 | Temp 98.7°F | Ht 64.0 in | Wt 270.0 lb

## 2022-02-05 DIAGNOSIS — Z0001 Encounter for general adult medical examination with abnormal findings: Secondary | ICD-10-CM | POA: Diagnosis not present

## 2022-02-05 DIAGNOSIS — Z713 Dietary counseling and surveillance: Secondary | ICD-10-CM

## 2022-02-05 DIAGNOSIS — I10 Essential (primary) hypertension: Secondary | ICD-10-CM

## 2022-02-05 DIAGNOSIS — M25551 Pain in right hip: Secondary | ICD-10-CM

## 2022-02-05 DIAGNOSIS — K219 Gastro-esophageal reflux disease without esophagitis: Secondary | ICD-10-CM

## 2022-02-05 DIAGNOSIS — Z0000 Encounter for general adult medical examination without abnormal findings: Secondary | ICD-10-CM | POA: Diagnosis not present

## 2022-02-05 DIAGNOSIS — J4531 Mild persistent asthma with (acute) exacerbation: Secondary | ICD-10-CM | POA: Diagnosis not present

## 2022-02-05 DIAGNOSIS — M7071 Other bursitis of hip, right hip: Secondary | ICD-10-CM

## 2022-02-05 DIAGNOSIS — R21 Rash and other nonspecific skin eruption: Secondary | ICD-10-CM

## 2022-02-05 DIAGNOSIS — F411 Generalized anxiety disorder: Secondary | ICD-10-CM

## 2022-02-05 DIAGNOSIS — R000 Tachycardia, unspecified: Secondary | ICD-10-CM

## 2022-02-05 MED ORDER — ESCITALOPRAM OXALATE 5 MG PO TABS: 5.0000 mg | ORAL_TABLET | Freq: Every day | ORAL | 1 refills | Status: DC

## 2022-02-05 MED ORDER — TRIAMCINOLONE ACETONIDE 0.5 % EX OINT
1.0000 | TOPICAL_OINTMENT | Freq: Two times a day (BID) | CUTANEOUS | 1 refills | Status: DC
Start: 2022-02-05 — End: 2024-04-21

## 2022-02-05 MED ORDER — PHENTERMINE HCL 37.5 MG PO TABS: 37.5000 mg | ORAL_TABLET | Freq: Every day | ORAL | 2 refills | Status: DC

## 2022-02-05 MED ORDER — HYDROCHLOROTHIAZIDE 12.5 MG PO CAPS: ORAL_CAPSULE | ORAL | 0 refills | Status: DC

## 2022-02-05 MED ORDER — HYDROXYZINE PAMOATE 25 MG PO CAPS: ORAL_CAPSULE | ORAL | 1 refills | Status: DC

## 2022-02-05 MED ORDER — NORGESTIMATE-ETH ESTRADIOL 0.25-35 MG-MCG PO TABS: 1.0000 | ORAL_TABLET | Freq: Every day | ORAL | 3 refills | Status: DC

## 2022-02-05 MED ORDER — LOSARTAN POTASSIUM 50 MG PO TABS: 50.0000 mg | ORAL_TABLET | Freq: Every day | ORAL | 0 refills | Status: DC

## 2022-02-05 MED ORDER — PROPRANOLOL HCL 10 MG PO TABS: ORAL_TABLET | ORAL | 1 refills | Status: DC

## 2022-02-05 MED ORDER — MONTELUKAST SODIUM 10 MG PO TABS: 10.0000 mg | ORAL_TABLET | Freq: Every day | ORAL | 0 refills | Status: DC

## 2022-02-05 MED ORDER — OMEPRAZOLE 20 MG PO CPDR: DELAYED_RELEASE_CAPSULE | ORAL | 0 refills | Status: DC

## 2022-02-05 NOTE — Patient Instructions (Signed)

## 2022-02-05 NOTE — Progress Notes (Signed)
Subjective:    Patient ID: Jacqueline Lowery, female    DOB: 26-Oct-1980, 41 y.o.   MRN: [id number]  Chief Complaint  Patient presents with   Medication Refill   PT presents to the office today for CPE without pap.  Continues to have intermittent hives when she is in the sun too much. She reports the Vistaril as needed helps.   She is requesting information on weight loss medications. She has tried diets without success.  Hypertension This is a chronic problem. The current episode started more than 1 year ago. The problem has been resolved since onset. Associated symptoms include anxiety. Pertinent negatives include no malaise/fatigue, peripheral edema or shortness of breath. Risk factors for coronary artery disease include obesity. The current treatment provides mild improvement.  Asthma She complains of wheezing. There is no cough or shortness of breath. This is a chronic problem. Associated symptoms include nasal congestion. Pertinent negatives include no malaise/fatigue. Her symptoms are aggravated by pollen. Her symptoms are alleviated by rest. She reports moderate improvement on treatment. Her past medical history is significant for asthma.  Anxiety Presents for follow-up visit. Symptoms include excessive worry and nervous/anxious behavior. Patient reports no irritability or shortness of breath. Symptoms occur occasionally. The severity of symptoms is moderate.   Her past medical history is significant for asthma.  Hip Pain  The incident occurred more than 1 week ago. The pain is present in the right hip. The quality of the pain is described as aching. The pain is moderate. Associated symptoms include numbness. She reports no foreign bodies present. The symptoms are aggravated by weight bearing. She has tried NSAIDs for the symptoms. The treatment provided mild relief.     Review of Systems  Constitutional:  Negative for irritability and malaise/fatigue.  Respiratory:  Positive for  wheezing. Negative for cough and shortness of breath.   Neurological:  Positive for numbness.  Psychiatric/Behavioral:  The patient is nervous/anxious.   All other systems reviewed and are negative.     Objective:   Physical Exam Vitals reviewed.  Constitutional:      General: She is not in acute distress.    Appearance: She is well-developed. She is obese.  HENT:     Head: Normocephalic and atraumatic.     Right Ear: Tympanic membrane normal.     Left Ear: Tympanic membrane normal.  Eyes:     Pupils: Pupils are equal, round, and reactive to light.  Neck:     Thyroid: No thyromegaly.  Cardiovascular:     Rate and Rhythm: Normal rate and regular rhythm.     Heart sounds: Normal heart sounds. No murmur heard. Pulmonary:     Effort: Pulmonary effort is normal. No respiratory distress.     Breath sounds: Normal breath sounds. No wheezing.  Abdominal:     General: Bowel sounds are normal. There is no distension.     Palpations: Abdomen is soft.     Tenderness: There is no abdominal tenderness.  Musculoskeletal:        General: Tenderness (pain in right hip with palpation) present. Normal range of motion.     Cervical back: Normal range of motion and neck supple.  Skin:    General: Skin is warm and dry.  Neurological:     Mental Status: She is alert and oriented to person, place, and time.     Cranial Nerves: No cranial nerve deficit.     Deep Tendon Reflexes: Reflexes are normal and symmetric.  Psychiatric:        Behavior: Behavior normal.        Thought Content: Thought content normal.        Judgment: Judgment normal.      BP 128/90   Pulse 80   Temp 98.7 F (37.1 C)   Ht 5' 4"  (1.626 m)   Wt 270 lb (122.5 kg)   LMP 01/08/2022   SpO2 99%   BMI 46.35 kg/m      Assessment & Plan:  Jacqueline Lowery comes in today with chief complaint of Medication Refill   Diagnosis and orders addressed:  1. Rash and nonspecific skin eruption - hydrOXYzine (VISTARIL) 25 MG  capsule; TAKE ONE CAPSULE THREE TIMES DAILY AS NEEDED  Dispense: 90 capsule; Refill: 1 - CMP14+EGFR - CBC with Differential/Platelet  2. Essential hypertension - losartan (COZAAR) 50 MG tablet; Take 1 tablet (50 mg total) by mouth daily.  Dispense: 90 tablet; Refill: 0 - hydrochlorothiazide (MICROZIDE) 12.5 MG capsule; TAKE 1 CAPSULE BY MOUTH EVERY DAY  Dispense: 90 capsule; Refill: 0 - CMP14+EGFR - CBC with Differential/Platelet  3. Mild persistent asthma with acute exacerbation - montelukast (SINGULAIR) 10 MG tablet; Take 1 tablet (10 mg total) by mouth at bedtime.  Dispense: 90 tablet; Refill: 0 - CMP14+EGFR - CBC with Differential/Platelet  4. Gastroesophageal reflux disease, unspecified whether esophagitis present - omeprazole (PRILOSEC) 20 MG capsule; TAKE ONE CAPSULE DAILY  Dispense: 90 capsule; Refill: 0 - CMP14+EGFR - CBC with Differential/Platelet  5. Tachycardia - propranolol (INDERAL) 10 MG tablet; TAKE (1) TABLET 3 TIMES A DAY AS NEEDED.  Dispense: 270 tablet; Refill: 1 - CMP14+EGFR - CBC with Differential/Platelet  6. Severe obesity (BMI >= 40) (HCC) - CMP14+EGFR - CBC with Differential/Platelet  7. GAD (generalized anxiety disorder) Start Lexapro 5 mg  Stress management  - CMP14+EGFR - CBC with Differential/Platelet  8. Annual physical exam - CMP14+EGFR - CBC with Differential/Platelet - Lipid panel - TSH  9. Pain of right hip Take 600 mg of Motrin  ROM exercise s  10. Bursitis of right hip, unspecified bursa   11. Weight loss counseling, encounter for She will call insurance and see if they cover. Is so will send in [facility] Discussed sign effects and encourage healthy diet and exercise.    Labs pending Health Maintenance reviewed Diet and exercise encouraged  Follow up plan: 2 months for GAD and weight loss   Evelina Dun, FNP

## 2022-02-06 LAB — LIPID PANEL
Chol/HDL Ratio: 3.3 ratio (ref 0.0–4.4)
Cholesterol, Total: 147 mg/dL (ref 100–199)
HDL: 44 mg/dL (ref >39)
LDL Chol Calc (NIH): 82 mg/dL (ref 0–99)
Triglycerides: 118 mg/dL (ref 0–149)
VLDL Cholesterol Cal: 21 mg/dL (ref 5–40)

## 2022-02-06 LAB — CBC WITH DIFFERENTIAL/PLATELET
Basophils Absolute: 0.1 10*3/uL (ref 0.0–0.2)
Basos: 1 %
EOS (ABSOLUTE): 0.3 10*3/uL (ref 0.0–0.4)
Eos: 4 %
Hematocrit: 41.2 % (ref 34.0–46.6)
Hemoglobin: 14.5 g/dL (ref 11.1–15.9)
Immature Grans (Abs): 0 10*3/uL (ref 0.0–0.1)
Immature Granulocytes: 0 %
Lymphocytes Absolute: 1.1 10*3/uL (ref 0.7–3.1)
Lymphs: 16 %
MCH: 30 pg (ref 26.6–33.0)
MCHC: 35.2 g/dL (ref 31.5–35.7)
MCV: 85 fL (ref 79–97)
Monocytes Absolute: 0.5 10*3/uL (ref 0.1–0.9)
Monocytes: 7 %
Neutrophils Absolute: 5.1 10*3/uL (ref 1.4–7.0)
Neutrophils: 72 %
Platelets: 333 10*3/uL (ref 150–450)
RBC: 4.83 x10E6/uL (ref 3.77–5.28)
RDW: 12.8 % (ref 11.7–15.4)
WBC: 7 10*3/uL (ref 3.4–10.8)

## 2022-02-06 LAB — CMP14+EGFR
ALT: 13 IU/L (ref 0–32)
AST: 14 IU/L (ref 0–40)
Albumin/Globulin Ratio: 1.3 (ref 1.2–2.2)
Albumin: 3.9 g/dL (ref 3.8–4.8)
Alkaline Phosphatase: 74 IU/L (ref 44–121)
BUN/Creatinine Ratio: 14 (ref 9–23)
BUN: 10 mg/dL (ref 6–20)
Bilirubin Total: 0.6 mg/dL (ref 0.0–1.2)
CO2: 24 mmol/L (ref 20–29)
Calcium: 9.5 mg/dL (ref 8.7–10.2)
Chloride: 98 mmol/L (ref 96–106)
Creatinine, Ser: 0.72 mg/dL (ref 0.57–1.00)
Globulin, Total: 2.9 g/dL (ref 1.5–4.5)
Glucose: 95 mg/dL (ref 70–99)
Potassium: 3.9 mmol/L (ref 3.5–5.2)
Sodium: 137 mmol/L (ref 134–144)
Total Protein: 6.8 g/dL (ref 6.0–8.5)
eGFR: 110 mL/min/{1.73_m2} (ref >59)

## 2022-02-06 LAB — TSH: TSH: 2.25 u[IU]/mL (ref 0.450–4.500)

## 2022-03-06 ENCOUNTER — Other Ambulatory Visit: Payer: Self-pay | Admitting: Family

## 2022-03-06 DIAGNOSIS — J4531 Mild persistent asthma with (acute) exacerbation: Secondary | ICD-10-CM

## 2022-03-06 DIAGNOSIS — K219 Gastro-esophageal reflux disease without esophagitis: Secondary | ICD-10-CM

## 2022-03-06 DIAGNOSIS — I10 Essential (primary) hypertension: Secondary | ICD-10-CM

## 2022-05-08 ENCOUNTER — Other Ambulatory Visit: Payer: Self-pay | Admitting: Family

## 2022-05-08 ENCOUNTER — Other Ambulatory Visit: Payer: Self-pay | Admitting: Family Medicine

## 2022-05-08 ENCOUNTER — Telehealth: Payer: Self-pay | Admitting: Family

## 2022-05-08 MED ORDER — NORGESTIMATE-ETH ESTRADIOL 0.25-35 MG-MCG PO TABS: 1.0000 | ORAL_TABLET | Freq: Every day | ORAL | 3 refills | Status: DC

## 2022-05-08 NOTE — Telephone Encounter (Signed)
Talked to [facility]

## 2022-07-02 ENCOUNTER — Other Ambulatory Visit: Payer: Self-pay | Admitting: Family

## 2022-07-02 DIAGNOSIS — J4531 Mild persistent asthma with (acute) exacerbation: Secondary | ICD-10-CM

## 2022-07-03 ENCOUNTER — Telehealth: Payer: Self-pay | Admitting: Physician Assistant

## 2022-07-03 DIAGNOSIS — J4541 Moderate persistent asthma with (acute) exacerbation: Secondary | ICD-10-CM

## 2022-07-03 MED ORDER — PREDNISONE 10 MG (21) PO TBPK: ORAL_TABLET | ORAL | 0 refills | Status: DC

## 2022-07-03 MED ORDER — BENZONATATE 100 MG PO CAPS: 100.0000 mg | ORAL_CAPSULE | Freq: Three times a day (TID) | ORAL | 0 refills | Status: DC | PRN

## 2022-07-03 MED ORDER — AZITHROMYCIN 250 MG PO TABS: ORAL_TABLET | ORAL | 0 refills | Status: AC

## 2022-07-03 NOTE — Progress Notes (Signed)
We are sorry that you are not feeling well.  Here is how we plan to help!  Based on your presentation I believe you most likely have A cough due to bacteria.  When patients have a fever and a productive cough with a change in color or increased sputum production, we are concerned about bacterial bronchitis.  If left untreated it can progress to pneumonia.  If your symptoms do not improve with your treatment plan it is important that you contact your provider.   I have prescribed Azithromyin 250 mg: two tablets now and then one tablet daily for 4 additonal days    In addition you may use A non-prescription cough medication called Mucinex DM: take 2 tablets every 12 hours. and A prescription cough medication called Tessalon Perles 100mg. You may take 1-2 capsules every 8 hours as needed for your cough.  Prednisone 10 mg daily for 6 days (see taper instructions below)  Directions for 6 day taper: Day 1: 2 tablets before breakfast, 1 after both lunch & dinner and 2 at bedtime Day 2: 1 tab before breakfast, 1 after both lunch & dinner and 2 at bedtime Day 3: 1 tab at each meal & 1 at bedtime Day 4: 1 tab at breakfast, 1 at lunch, 1 at bedtime Day 5: 1 tab at breakfast & 1 tab at bedtime Day 6: 1 tab at breakfast  From your responses in the eVisit questionnaire you describe inflammation in the upper respiratory tract which is causing a significant cough.  This is commonly called Bronchitis and has four common causes:   Allergies Viral Infections Acid Reflux Bacterial Infection Allergies, viruses and acid reflux are treated by controlling symptoms or eliminating the cause. An example might be a cough caused by taking certain blood pressure medications. You stop the cough by changing the medication. Another example might be a cough caused by acid reflux. Controlling the reflux helps control the cough.  USE OF BRONCHODILATOR ("RESCUE") INHALERS: There is a risk from using your bronchodilator too  frequently.  The risk is that over-reliance on a medication which only relaxes the muscles surrounding the breathing tubes can reduce the effectiveness of medications prescribed to reduce swelling and congestion of the tubes themselves.  Although you feel brief relief from the bronchodilator inhaler, your asthma may actually be worsening with the tubes becoming more swollen and filled with mucus.  This can delay other crucial treatments, such as oral steroid medications. If you need to use a bronchodilator inhaler daily, several times per day, you should discuss this with your provider.  There are probably better treatments that could be used to keep your asthma under control.     HOME CARE Only take medications as instructed by your medical team. Complete the entire course of an antibiotic. Drink plenty of fluids and get plenty of rest. Avoid close contacts especially the very young and the elderly Cover your mouth if you cough or cough into your sleeve. Always remember to wash your hands A steam or ultrasonic humidifier can help congestion.   GET HELP RIGHT AWAY IF: You develop worsening fever. You become short of breath You cough up blood. Your symptoms persist after you have completed your treatment plan MAKE SURE YOU  Understand these instructions. Will watch your condition. Will get help right away if you are not doing well or get worse.    Thank you for choosing an e-visit.  Your e-visit answers were reviewed by a board certified advanced clinical   practitioner to complete your personal care plan. Depending upon the condition, your plan could have included both over the counter or prescription medications.  Please review your pharmacy choice. Make sure the pharmacy is open so you can pick up prescription now. If there is a problem, you may contact your provider through MyChart messaging and have the prescription routed to another pharmacy.  Your safety is important to us. If you have  drug allergies check your prescription carefully.   For the next 24 hours you can use MyChart to ask questions about today's visit, request a non-urgent call back, or ask for a work or school excuse. You will get an email in the next two days asking about your experience. I hope that your e-visit has been valuable and will speed your recovery.  I have spent 5 minutes in review of e-visit questionnaire, review and updating patient chart, medical decision making and response to patient.   Ramina Hulet M Raymont Andreoni, PA-C  

## 2022-07-18 ENCOUNTER — Other Ambulatory Visit: Payer: Self-pay | Admitting: Family

## 2022-07-18 DIAGNOSIS — J4531 Mild persistent asthma with (acute) exacerbation: Secondary | ICD-10-CM

## 2022-07-18 DIAGNOSIS — K219 Gastro-esophageal reflux disease without esophagitis: Secondary | ICD-10-CM

## 2022-07-18 DIAGNOSIS — I10 Essential (primary) hypertension: Secondary | ICD-10-CM

## 2022-08-07 ENCOUNTER — Other Ambulatory Visit: Payer: Self-pay | Admitting: Family

## 2022-08-07 DIAGNOSIS — F411 Generalized anxiety disorder: Secondary | ICD-10-CM

## 2022-08-11 ENCOUNTER — Telehealth: Payer: BC Managed Care – PPO | Admitting: Family

## 2022-08-11 ENCOUNTER — Other Ambulatory Visit: Payer: Self-pay | Admitting: Family

## 2022-08-11 DIAGNOSIS — J209 Acute bronchitis, unspecified: Secondary | ICD-10-CM | POA: Diagnosis not present

## 2022-08-11 DIAGNOSIS — J4541 Moderate persistent asthma with (acute) exacerbation: Secondary | ICD-10-CM

## 2022-08-11 MED ORDER — PROMETHAZINE-DM 6.25-15 MG/5ML PO SYRP: 5.0000 mL | ORAL_SOLUTION | Freq: Three times a day (TID) | ORAL | 0 refills | Status: DC | PRN

## 2022-08-11 MED ORDER — PREDNISONE 10 MG (21) PO TBPK: ORAL_TABLET | ORAL | 0 refills | Status: DC

## 2022-08-11 MED ORDER — BENZONATATE 100 MG PO CAPS: 100.0000 mg | ORAL_CAPSULE | Freq: Three times a day (TID) | ORAL | 0 refills | Status: DC | PRN

## 2022-08-11 NOTE — Telephone Encounter (Signed)
TC to [place] at work - started in her head now in her chest & wheezing, cold & cough. She is constantly coughing, has Tessalon Perles from last visit but they are not helping. She did start using her nebulizer at night. 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 She does have an appt on 08/20/22 for her 6 mos ckup. Please advise on refills.

## 2022-08-11 NOTE — Progress Notes (Signed)
We are sorry that you are not feeling well.  Here is how we plan to help!  Based on your presentation I believe you most likely have A cough due to a virus.  This is called viral bronchitis and is best treated by rest, plenty of fluids and control of the cough.  You may use Ibuprofen or Tylenol as directed to help your symptoms.     In addition you may use A non-prescription cough medication called Robitussin DAC. Take 2 teaspoons every 8 hours or Delsym: take 2 teaspoons every 12 hours., A non-prescription cough medication called Mucinex DM: take 2 tablets every 12 hours., and A prescription cough medication called Tessalon Perles 100mg . You may take 1-2 capsules every 8 hours as needed for your cough. I have also sent you in some cough syrup.   Prednisone 10 mg daily for 6 days (see taper instructions below)  Directions for 6 day taper: Day 1: 2 tablets before breakfast, 1 after both lunch & dinner and 2 at bedtime Day 2: 1 tab before breakfast, 1 after both lunch & dinner and 2 at bedtime Day 3: 1 tab at each meal & 1 at bedtime Day 4: 1 tab at breakfast, 1 at lunch, 1 at bedtime Day 5: 1 tab at breakfast & 1 tab at bedtime Day 6: 1 tab at breakfast  From your responses in the eVisit questionnaire you describe inflammation in the upper respiratory tract which is causing a significant cough.  This is commonly called Bronchitis and has four common causes:   Allergies Viral Infections Acid Reflux Bacterial Infection Allergies, viruses and acid reflux are treated by controlling symptoms or eliminating the cause. An example might be a cough caused by taking certain blood pressure medications. You stop the cough by changing the medication. Another example might be a cough caused by acid reflux. Controlling the reflux helps control the cough.  USE OF BRONCHODILATOR ("RESCUE") INHALERS: There is a risk from using your bronchodilator too frequently.  The risk is that over-reliance on a medication  which only relaxes the muscles surrounding the breathing tubes can reduce the effectiveness of medications prescribed to reduce swelling and congestion of the tubes themselves.  Although you feel brief relief from the bronchodilator inhaler, your asthma may actually be worsening with the tubes becoming more swollen and filled with mucus.  This can delay other crucial treatments, such as oral steroid medications. If you need to use a bronchodilator inhaler daily, several times per day, you should discuss this with your provider.  There are probably better treatments that could be used to keep your asthma under control.     HOME CARE Only take medications as instructed by your medical team. Complete the entire course of an antibiotic. Drink plenty of fluids and get plenty of rest. Avoid close contacts especially the very young and the elderly Cover your mouth if you cough or cough into your sleeve. Always remember to wash your hands A steam or ultrasonic humidifier can help congestion.   GET HELP RIGHT AWAY IF: You develop worsening fever. You become short of breath You cough up blood. Your symptoms persist after you have completed your treatment plan MAKE SURE YOU  Understand these instructions. Will watch your condition. Will get help right away if you are not doing well or get worse.    Thank you for choosing an e-visit.  Your e-visit answers were reviewed by a board certified advanced clinical practitioner to complete your personal care plan.  Depending upon the condition, your plan could have included both over the counter or prescription medications.  Please review your pharmacy choice. Make sure the pharmacy is open so you can pick up prescription now. If there is a problem, you may contact your provider through [employer] and have the prescription routed to another pharmacy.  Your safety is important to [place]. If you have drug allergies check your prescription carefully.   For  the next 24 hours you can use MyChart to ask questions about today's visit, request a non-urgent call back, or ask for a work or school excuse. You will get an email in the next two days asking about your experience. I hope that your e-visit has been valuable and will speed your recovery.  Approximately 5 minutes was spent documenting and reviewing patient's chart.

## 2022-08-20 ENCOUNTER — Encounter: Payer: Self-pay | Admitting: Family

## 2022-08-20 ENCOUNTER — Ambulatory Visit (INDEPENDENT_AMBULATORY_CARE_PROVIDER_SITE_OTHER): Payer: BC Managed Care – PPO | Admitting: Family

## 2022-08-20 VITALS — BP 115/78 | HR 79 | Temp 97.1°F | Ht 64.0 in | Wt 266.4 lb

## 2022-08-20 DIAGNOSIS — I10 Essential (primary) hypertension: Secondary | ICD-10-CM

## 2022-08-20 DIAGNOSIS — Z713 Dietary counseling and surveillance: Secondary | ICD-10-CM

## 2022-08-20 DIAGNOSIS — J4521 Mild intermittent asthma with (acute) exacerbation: Secondary | ICD-10-CM

## 2022-08-20 DIAGNOSIS — J4531 Mild persistent asthma with (acute) exacerbation: Secondary | ICD-10-CM | POA: Diagnosis not present

## 2022-08-20 DIAGNOSIS — F411 Generalized anxiety disorder: Secondary | ICD-10-CM

## 2022-08-20 MED ORDER — PREDNISONE 20 MG PO TABS: 40.0000 mg | ORAL_TABLET | Freq: Every day | ORAL | 0 refills | Status: AC

## 2022-08-20 MED ORDER — PHENTERMINE HCL 37.5 MG PO TABS: 37.5000 mg | ORAL_TABLET | Freq: Every day | ORAL | 2 refills | Status: DC

## 2022-08-20 MED ORDER — BUDESONIDE-FORMOTEROL FUMARATE 80-4.5 MCG/ACT IN AERO: 2.0000 | INHALATION_SPRAY | Freq: Two times a day (BID) | RESPIRATORY_TRACT | 3 refills | Status: DC

## 2022-08-20 NOTE — Progress Notes (Signed)
Subjective:    Patient ID: Jacqueline Lowery, female    DOB: December 11, 1980, 41 y.o.   MRN: [id number]  Chief Complaint  Patient presents with   Medical Management of Chronic Issues   Pt presents to the office today to recheck weight loss. She has been taking phentermine on and off since June. She has lost 4 lbs.      08/20/2022    7:59 AM 02/05/2022    8:22 AM 03/17/2021    4:18 PM  Last 3 Weights  Weight (lbs) 266 lb 6.4 oz 270 lb 272 lb 6.4 oz  Weight (kg) 120.838 kg 122.471 kg 123.56 kg     Hypertension This is a chronic problem. The current episode started more than 1 year ago. The problem has been resolved since onset. The problem is controlled. Associated symptoms include anxiety and shortness of breath. Pertinent negatives include no malaise/fatigue or peripheral edema. Risk factors for coronary artery disease include obesity. The current treatment provides moderate improvement.  Asthma She complains of shortness of breath and wheezing. There is no cough. This is a chronic problem. The current episode started more than 1 year ago. The problem occurs intermittently. The problem has been waxing and waning. Pertinent negatives include no malaise/fatigue. Her symptoms are alleviated by rest. Her symptoms are not alleviated by prescription cough suppressant and rest. Her past medical history is significant for asthma.  Anxiety Presents for follow-up visit. Symptoms include excessive worry, irritability, nervous/anxious behavior and shortness of breath. Symptoms occur occasionally. The severity of symptoms is moderate.   Her past medical history is significant for asthma.      Review of Systems  Constitutional:  Positive for irritability. Negative for malaise/fatigue.  Respiratory:  Positive for shortness of breath and wheezing. Negative for cough.   Psychiatric/Behavioral:  The patient is nervous/anxious.   All other systems reviewed and are negative.      Objective:   Physical  Exam Vitals reviewed.  Constitutional:      General: She is not in acute distress.    Appearance: She is well-developed. She is obese.  HENT:     Head: Normocephalic and atraumatic.     Right Ear: Tympanic membrane normal.     Left Ear: Tympanic membrane normal.  Eyes:     Pupils: Pupils are equal, round, and reactive to light.  Neck:     Thyroid: No thyromegaly.  Cardiovascular:     Rate and Rhythm: Normal rate and regular rhythm.     Heart sounds: Normal heart sounds. No murmur heard. Pulmonary:     Effort: Pulmonary effort is normal. No respiratory distress.     Breath sounds: Normal breath sounds. No wheezing.  Abdominal:     General: Bowel sounds are normal. There is no distension.     Palpations: Abdomen is soft.     Tenderness: There is no abdominal tenderness.  Musculoskeletal:        General: No tenderness. Normal range of motion.     Cervical back: Normal range of motion and neck supple.  Skin:    General: Skin is warm and dry.  Neurological:     Mental Status: She is alert and oriented to person, place, and time.     Cranial Nerves: No cranial nerve deficit.     Deep Tendon Reflexes: Reflexes are normal and symmetric.  Psychiatric:        Behavior: Behavior normal.        Thought Content: Thought content normal.  Judgment: Judgment normal.       BP 115/78   Pulse 79   Temp (!) 97.1 F (36.2 C) (Temporal)   Ht _0  (1.626 m)   Wt 266 lb 6.4 oz (120.8 kg)   SpO2 97%   BMI 45.73 kg/m      Assessment & Plan:  SHANELL ADEN comes in today with chief complaint of Medical Management of Chronic Issues   Diagnosis and orders addressed:  1. Essential hypertension - CMP14+EGFR  2. Severe obesity (BMI >= 40) (HCC) - phentermine (ADIPEX-P) 37.5 MG tablet; Take 1 tablet (37.5 mg total) by mouth daily before breakfast.  Dispense: 30 tablet; Refill: 2 - CMP14+EGFR  3. GAD (generalized anxiety disorder) - CMP14+EGFR  4. Mild persistent asthma with  acute exacerbation - CMP14+EGFR  5. Weight loss counseling, encounter for - phentermine (ADIPEX-P) 37.5 MG tablet; Take 1 tablet (37.5 mg total) by mouth daily before breakfast.  Dispense: 30 tablet; Refill: 2 - CMP14+EGFR  6. Mild intermittent asthma with exacerbation Start Symbicort BID Continue Singulair  - predniSONE (DELTASONE) 20 MG tablet; Take 2 tablets (40 mg total) by mouth daily with breakfast for 5 days.  Dispense: 10 tablet; Refill: 0 - CMP14+EGFR - budesonide-formoterol (SYMBICORT) 80-4.5 MCG/ACT inhaler; Inhale 2 puffs into the lungs 2 (two) times daily.  Dispense: 1 each; Refill: 3    Labs pending Health Maintenance reviewed Diet and exercise encouraged  Follow up plan: 3 months    Evelina Dun, FNP

## 2022-08-20 NOTE — Patient Instructions (Signed)

## 2022-08-25 LAB — CMP14+EGFR
ALT: 21 IU/L (ref 0–32)
AST: 23 IU/L (ref 0–40)
Albumin/Globulin Ratio: 1.3 (ref 1.2–2.2)
Albumin: 4.1 g/dL (ref 3.9–4.9)
Alkaline Phosphatase: 75 IU/L (ref 44–121)
BUN/Creatinine Ratio: 16 (ref 9–23)
BUN: 12 mg/dL (ref 6–20)
Bilirubin Total: 0.8 mg/dL (ref 0.0–1.2)
CO2: 23 mmol/L (ref 20–29)
Calcium: 9.3 mg/dL (ref 8.7–10.2)
Chloride: 99 mmol/L (ref 96–106)
Creatinine, Ser: 0.75 mg/dL (ref 0.57–1.00)
Globulin, Total: 3.1 g/dL (ref 1.5–4.5)
Glucose: 93 mg/dL (ref 70–99)
Potassium: 3.9 mmol/L (ref 3.5–5.2)
Sodium: 140 mmol/L (ref 134–144)
Total Protein: 7.2 g/dL (ref 6.0–8.5)
eGFR: 104 mL/min/{1.73_m2} (ref >59)

## 2022-09-04 ENCOUNTER — Other Ambulatory Visit: Payer: Self-pay | Admitting: Family

## 2022-09-04 DIAGNOSIS — J4531 Mild persistent asthma with (acute) exacerbation: Secondary | ICD-10-CM

## 2022-09-18 ENCOUNTER — Other Ambulatory Visit: Payer: Self-pay | Admitting: Family

## 2022-10-16 ENCOUNTER — Other Ambulatory Visit: Payer: Self-pay | Admitting: Family

## 2022-10-16 DIAGNOSIS — K219 Gastro-esophageal reflux disease without esophagitis: Secondary | ICD-10-CM

## 2022-10-16 DIAGNOSIS — I10 Essential (primary) hypertension: Secondary | ICD-10-CM

## 2022-10-16 DIAGNOSIS — J4531 Mild persistent asthma with (acute) exacerbation: Secondary | ICD-10-CM

## 2022-10-16 DIAGNOSIS — R000 Tachycardia, unspecified: Secondary | ICD-10-CM

## 2022-11-06 ENCOUNTER — Ambulatory Visit: Payer: BC Managed Care – PPO | Admitting: Family

## 2022-11-12 ENCOUNTER — Encounter: Payer: Self-pay | Admitting: Family

## 2022-11-12 ENCOUNTER — Ambulatory Visit (INDEPENDENT_AMBULATORY_CARE_PROVIDER_SITE_OTHER): Payer: BC Managed Care – PPO | Admitting: Family

## 2022-11-12 ENCOUNTER — Ambulatory Visit: Payer: BC Managed Care – PPO | Admitting: Family Medicine

## 2022-11-12 VITALS — BP 135/88 | HR 93 | Temp 98.1°F | Ht 64.0 in | Wt 269.4 lb

## 2022-11-12 DIAGNOSIS — M79604 Pain in right leg: Secondary | ICD-10-CM

## 2022-11-12 DIAGNOSIS — M5431 Sciatica, right side: Secondary | ICD-10-CM | POA: Diagnosis not present

## 2022-11-12 DIAGNOSIS — G5602 Carpal tunnel syndrome, left upper limb: Secondary | ICD-10-CM | POA: Diagnosis not present

## 2022-11-12 MED ORDER — PREDNISONE 10 MG (21) PO TBPK: ORAL_TABLET | ORAL | 0 refills | Status: DC

## 2022-11-12 MED ORDER — DICLOFENAC SODIUM 75 MG PO TBEC: 75.0000 mg | DELAYED_RELEASE_TABLET | Freq: Two times a day (BID) | ORAL | 0 refills | Status: DC

## 2022-11-12 NOTE — Addendum Note (Signed)
Addended by: Evelina Dun A on: 11/12/2022 10:04 AM   Modules accepted: Orders, Level of Service

## 2022-11-12 NOTE — Patient Instructions (Signed)

## 2022-11-12 NOTE — Progress Notes (Addendum)
Subjective:    Patient ID: Jacqueline Lowery, female    DOB: 1981-04-12, 42 y.o.   MRN: [id number]  Chief Complaint  Patient presents with   Leg Pain   Hip Pain    Right side runs down leg. Sometimes goes in back.   PT presents to the office today with right leg pain that radiates down her leg that started three weeks ago.   She is complaining of left wrist numbness and tingling that is worse when she wakes up or driving. This has been going on for months. She has worse braces with mild relief.  Leg Pain  The incident occurred more than 1 week ago. There was no injury mechanism. The pain is present in the right leg. The quality of the pain is described as aching. The pain is at a severity of 6/10. The pain is moderate. The pain has been Intermittent since onset. She reports no foreign bodies present. The symptoms are aggravated by movement. She has tried acetaminophen and NSAIDs for the symptoms. The treatment provided mild relief.  Hip Pain       Review of Systems  All other systems reviewed and are negative.      Objective:   Physical Exam Vitals reviewed.  Constitutional:      General: She is not in acute distress.    Appearance: She is well-developed. She is obese.  HENT:     Head: Normocephalic and atraumatic.  Eyes:     Pupils: Pupils are equal, round, and reactive to light.  Neck:     Thyroid: No thyromegaly.  Cardiovascular:     Rate and Rhythm: Normal rate and regular rhythm.     Heart sounds: Normal heart sounds. No murmur heard. Pulmonary:     Effort: Pulmonary effort is normal. No respiratory distress.     Breath sounds: Normal breath sounds. No wheezing.  Abdominal:     General: Bowel sounds are normal. There is no distension.     Palpations: Abdomen is soft.     Tenderness: There is no abdominal tenderness.  Musculoskeletal:        General: No tenderness. Normal range of motion.     Cervical back: Normal range of motion and neck supple.     Comments:  Negative SLR, positive tinel sign  Skin:    General: Skin is warm and dry.  Neurological:     Mental Status: She is alert and oriented to person, place, and time.     Cranial Nerves: No cranial nerve deficit.     Deep Tendon Reflexes: Reflexes are normal and symmetric.  Psychiatric:        Behavior: Behavior normal.        Thought Content: Thought content normal.        Judgment: Judgment normal.      BP 135/88   Pulse 93   Temp 98.1 F (36.7 C) (Temporal)   Ht '5\' 4"'$  (1.626 m)   Wt 269 lb 6.4 oz (122.2 kg)   SpO2 96%   BMI 46.24 kg/m       Assessment & Plan:  Jacqueline Lowery comes in today with chief complaint of Leg Pain and Hip Pain (Right side runs down leg. Sometimes goes in back.)   Diagnosis and orders addressed:  1. Right leg pain - diclofenac (VOLTAREN) 75 MG EC tablet; Take 1 tablet (75 mg total) by mouth 2 (two) times daily.  Dispense: 30 tablet; Refill: 0 - predniSONE (STERAPRED UNI-PAK  21 TAB) 10 MG (21) TBPK tablet; Use as directed  Dispense: 21 tablet; Refill: 0  2. Sciatica of right side ROM exercises discussed- Handout given  No other NSAID's while taking diclofenac  Take with food Follow up if symptoms worsen or do not improve  - diclofenac (VOLTAREN) 75 MG EC tablet; Take 1 tablet (75 mg total) by mouth 2 (two) times daily.  Dispense: 30 tablet; Refill: 0 - predniSONE (STERAPRED UNI-PAK 21 TAB) 10 MG (21) TBPK tablet; Use as directed  Dispense: 21 tablet; Refill: 0   3. Carpal tunnel syndrome of left wrist Wear wrist splint at night and when doing repetitive motions - diclofenac (VOLTAREN) 75 MG EC tablet; Take 1 tablet (75 mg total) by mouth 2 (two) times daily.  Dispense: 30 tablet; Refill: 0 - predniSONE (STERAPRED UNI-PAK 21 TAB) 10 MG (21) TBPK tablet; Use as directed  Dispense: 21 tablet; Refill: 0 - Ambulatory referral to [place], FNP

## 2022-12-02 DIAGNOSIS — G5603 Carpal tunnel syndrome, bilateral upper limbs: Secondary | ICD-10-CM | POA: Diagnosis not present

## 2022-12-10 ENCOUNTER — Other Ambulatory Visit: Payer: Self-pay | Admitting: Family

## 2022-12-14 ENCOUNTER — Ambulatory Visit (INDEPENDENT_AMBULATORY_CARE_PROVIDER_SITE_OTHER): Payer: BC Managed Care – PPO | Admitting: Family Medicine

## 2022-12-14 VITALS — BP 142/90 | HR 84 | Ht 64.0 in | Wt 271.0 lb

## 2022-12-14 DIAGNOSIS — M7061 Trochanteric bursitis, right hip: Secondary | ICD-10-CM | POA: Diagnosis not present

## 2022-12-14 DIAGNOSIS — M5431 Sciatica, right side: Secondary | ICD-10-CM | POA: Diagnosis not present

## 2022-12-14 MED ORDER — METHYLPREDNISOLONE ACETATE 80 MG/ML IJ SUSP
80.0000 mg | Freq: Once | INTRAMUSCULAR | Status: AC
  Administered 2022-12-14: 80 mg via INTRA_ARTICULAR

## 2022-12-14 MED ORDER — DICLOFENAC SODIUM 75 MG PO TBEC: 75.0000 mg | DELAYED_RELEASE_TABLET | Freq: Two times a day (BID) | ORAL | 0 refills | Status: DC

## 2022-12-14 NOTE — Progress Notes (Signed)
BP (!) 142/90   Pulse 84   Ht 5\' 4"  (1.626 m)   Wt 271 lb (122.9 kg)   SpO2 97%   BMI 46.52 kg/m    Subjective:   Patient ID: Jacqueline Lowery, female    DOB: [birth year], 42 y.o.   MRN: [id number]  HPI: Jacqueline Lowery is a 42 y.o. female presenting on 12/14/2022 for Hip Pain (Right. Radiates to right lower back and down right leg)   HPI Right lateral hip pain Patient comes in complaining of right lateral hip pain that hurts to touch on the lateral aspect of her hip.  She says when she came in about a month ago she was having that but then she was also having more pain in the posterior buttock with numbness shooting around to the front of her leg.  She did get some steroids and that part seems to have improved but she still having the right lateral hip pain that bothers her, gets stiff when she sits down and then worse when she is up and moving until it loosens up.  She did try some of the diclofenac oral and did not seem like that helped, did get better while she was on the prednisone and now it seems like it is back.  She admits she did not do much of stretching for it because she did not know exactly what to do.  She denies any numbness or weakness in that leg.  Relevant past medical, surgical, family and social history reviewed and updated as indicated. Interim medical history since our last visit reviewed. Allergies and medications reviewed and updated.  Review of Systems  Constitutional:  Negative for chills and fever.  Eyes:  Negative for visual disturbance.  Respiratory:  Negative for chest tightness and shortness of breath.   Cardiovascular:  Negative for chest pain and leg swelling.  Musculoskeletal:  Positive for arthralgias and myalgias. Negative for back pain, gait problem and joint swelling.  Skin:  Negative for rash.  Neurological:  Negative for light-headedness and headaches.  Psychiatric/Behavioral:  Negative for agitation and behavioral problems.   All other systems  reviewed and are negative.   Per HPI unless specifically indicated above   Allergies as of 12/14/2022       Reactions   Penicillins    REACTION: Rash   Amoxicillin Rash        Medication List        Accurate as of December 14, 2022  9:11 AM. If you have any questions, ask your nurse or doctor.          STOP taking these medications    predniSONE 10 MG (21) Tbpk tablet Commonly known as: STERAPRED UNI-PAK 21 TAB Stopped by: Elige Radon Mahmud Keithly, MD       TAKE these medications    albuterol (2.5 MG/3ML) 0.083% nebulizer solution Commonly known as: PROVENTIL TAKE NEBULIZATION EVERY 6 HOURS AS NEEDED FOR WHEEZING OR SHORTNESS OF BREATH   albuterol 108 (90 Base) MCG/ACT inhaler Commonly known as: VENTOLIN HFA INHALE 2 PUFFS EVERY 6 HOURS AS NEEDED FOR WHEEZING   budesonide-formoterol 80-4.5 MCG/ACT inhaler Commonly known as: SYMBICORT Inhale 2 puffs into the lungs 2 (two) times daily.   cetirizine 10 MG tablet Commonly known as: ZYRTEC Take 10 mg by mouth daily.   diclofenac 75 MG EC tablet Commonly known as: VOLTAREN Take 1 tablet (75 mg total) by mouth 2 (two) times daily.   escitalopram 5 MG tablet Commonly known  as: LEXAPRO TAKE 1 TABLET (5 MG TOTAL) BY MOUTH DAILY.   fluticasone 50 MCG/ACT nasal spray Commonly known as: FLONASE USE 2 SPRAYS IN EACH NOSTRIL ONCE DAILY.   hydrochlorothiazide 12.5 MG capsule Commonly known as: MICROZIDE TAKE 1 CAPSULE BY MOUTH EVERY DAY   losartan 50 MG tablet Commonly known as: COZAAR TAKE 1 TABLET BY MOUTH EVERY DAY   montelukast 10 MG tablet Commonly known as: SINGULAIR TAKE 1 TABLET BY MOUTH EVERYDAY AT BEDTIME   norgestimate-ethinyl estradiol 0.25-35 MG-MCG tablet Commonly known as: Estarylla Take 1 tablet by mouth daily.   omeprazole 20 MG capsule Commonly known as: PRILOSEC TAKE 1 CAPSULE BY MOUTH EVERY DAY   phentermine 37.5 MG tablet Commonly known as: ADIPEX-P Take 1 tablet (37.5 mg total) by  mouth daily before breakfast.   propranolol 10 MG tablet Commonly known as: INDERAL TAKE (1) TABLET 3 TIMES A DAY AS NEEDED.   triamcinolone ointment 0.5 % Commonly known as: KENALOG Apply 1 application. topically 2 (two) times daily.         Objective:   BP (!) 142/90   Pulse 84   Ht 5\' 4"  (1.626 m)   Wt 271 lb (122.9 kg)   SpO2 97%   BMI 46.52 kg/m   Wt Readings from Last 3 Encounters:  12/14/22 271 lb (122.9 kg)  11/12/22 269 lb 6.4 oz (122.2 kg)  08/20/22 266 lb 6.4 oz (120.8 kg)    Physical Exam Vitals and nursing note reviewed.  Constitutional:      General: She is not in acute distress.    Appearance: She is well-developed. She is not diaphoretic.  Eyes:     Conjunctiva/sclera: Conjunctivae normal.  Musculoskeletal:        General: Normal range of motion.     Right hip: Tenderness present. No deformity, bony tenderness or crepitus. Normal range of motion.       Legs:  Skin:    General: Skin is warm and dry.     Findings: No rash.  Neurological:     Mental Status: She is alert and oriented to person, place, and time.     Coordination: Coordination normal.  Psychiatric:        Behavior: Behavior normal.       Assessment & Plan:   Problem List Items Addressed This Visit   None Visit Diagnoses     Trochanteric bursitis of right hip    -  Primary   Relevant Medications   methylPREDNISolone acetate (DEPO-MEDROL) injection 80 mg (Start on 12/14/2022  9:15 AM)   diclofenac (VOLTAREN) 75 MG EC tablet   Sciatica of right side       Relevant Medications   diclofenac (VOLTAREN) 75 MG EC tablet       Gave some more diclofenac and steroid intramuscular injection and a list of stretches that she can do on her home, if not improved then would likely need to consider physical therapy referral. Follow up plan: Return if symptoms worsen or fail to improve.  Counseling provided for all of the vaccine components No orders of the defined types were placed in  this encounter.   Arville Care, MD [facility] 12/14/2022, 9:11 AM

## 2022-12-24 DIAGNOSIS — G5603 Carpal tunnel syndrome, bilateral upper limbs: Secondary | ICD-10-CM | POA: Diagnosis not present

## 2022-12-30 DIAGNOSIS — G5603 Carpal tunnel syndrome, bilateral upper limbs: Secondary | ICD-10-CM | POA: Diagnosis not present

## 2023-01-05 ENCOUNTER — Other Ambulatory Visit: Payer: Self-pay | Admitting: Family

## 2023-01-23 ENCOUNTER — Other Ambulatory Visit: Payer: Self-pay | Admitting: Family

## 2023-01-23 DIAGNOSIS — F411 Generalized anxiety disorder: Secondary | ICD-10-CM

## 2023-02-03 DIAGNOSIS — G5602 Carpal tunnel syndrome, left upper limb: Secondary | ICD-10-CM | POA: Diagnosis not present

## 2023-04-28 ENCOUNTER — Other Ambulatory Visit: Payer: Self-pay | Admitting: Family

## 2023-04-28 DIAGNOSIS — I10 Essential (primary) hypertension: Secondary | ICD-10-CM

## 2023-04-29 ENCOUNTER — Encounter: Payer: Self-pay | Admitting: Family

## 2023-04-29 ENCOUNTER — Ambulatory Visit (INDEPENDENT_AMBULATORY_CARE_PROVIDER_SITE_OTHER): Payer: BC Managed Care – PPO | Admitting: Family

## 2023-04-29 ENCOUNTER — Telehealth: Payer: Self-pay | Admitting: Family

## 2023-04-29 ENCOUNTER — Other Ambulatory Visit: Payer: Self-pay | Admitting: *Deleted

## 2023-04-29 VITALS — BP 129/89 | HR 85 | Temp 98.7°F | Ht 64.0 in | Wt 267.6 lb

## 2023-04-29 DIAGNOSIS — M5431 Sciatica, right side: Secondary | ICD-10-CM | POA: Diagnosis not present

## 2023-04-29 DIAGNOSIS — I10 Essential (primary) hypertension: Secondary | ICD-10-CM

## 2023-04-29 DIAGNOSIS — Z0000 Encounter for general adult medical examination without abnormal findings: Secondary | ICD-10-CM

## 2023-04-29 DIAGNOSIS — Z0001 Encounter for general adult medical examination with abnormal findings: Secondary | ICD-10-CM | POA: Diagnosis not present

## 2023-04-29 DIAGNOSIS — K219 Gastro-esophageal reflux disease without esophagitis: Secondary | ICD-10-CM

## 2023-04-29 DIAGNOSIS — J4531 Mild persistent asthma with (acute) exacerbation: Secondary | ICD-10-CM

## 2023-04-29 DIAGNOSIS — F411 Generalized anxiety disorder: Secondary | ICD-10-CM | POA: Diagnosis not present

## 2023-04-29 DIAGNOSIS — J301 Allergic rhinitis due to pollen: Secondary | ICD-10-CM

## 2023-04-29 DIAGNOSIS — L989 Disorder of the skin and subcutaneous tissue, unspecified: Secondary | ICD-10-CM

## 2023-04-29 DIAGNOSIS — R000 Tachycardia, unspecified: Secondary | ICD-10-CM

## 2023-04-29 DIAGNOSIS — Z713 Dietary counseling and surveillance: Secondary | ICD-10-CM

## 2023-04-29 MED ORDER — ESCITALOPRAM OXALATE 5 MG PO TABS
5.0000 mg | ORAL_TABLET | Freq: Every day | ORAL | 0 refills | Status: DC
Start: 2023-04-29 — End: 2023-07-26

## 2023-04-29 MED ORDER — DICLOFENAC SODIUM 75 MG PO TBEC
75.0000 mg | DELAYED_RELEASE_TABLET | Freq: Two times a day (BID) | ORAL | 0 refills | Status: DC
Start: 2023-04-29 — End: 2024-04-21

## 2023-04-29 MED ORDER — PROPRANOLOL HCL 10 MG PO TABS
ORAL_TABLET | ORAL | 0 refills | Status: DC
Start: 2023-04-29 — End: 2023-05-24

## 2023-04-29 MED ORDER — OMEPRAZOLE 20 MG PO CPDR
DELAYED_RELEASE_CAPSULE | ORAL | 1 refills | Status: DC
Start: 2023-04-29 — End: 2023-10-25

## 2023-04-29 MED ORDER — MONTELUKAST SODIUM 10 MG PO TABS
10.0000 mg | ORAL_TABLET | Freq: Every day | ORAL | 3 refills | Status: DC
Start: 2023-04-29 — End: 2024-04-21

## 2023-04-29 MED ORDER — PHENTERMINE HCL 37.5 MG PO TABS
37.5000 mg | ORAL_TABLET | Freq: Every day | ORAL | 2 refills | Status: DC
Start: 2023-04-29 — End: 2023-05-11

## 2023-04-29 MED ORDER — LOSARTAN POTASSIUM-HCTZ 50-12.5 MG PO TABS: 1.0000 | ORAL_TABLET | Freq: Every day | ORAL | 1 refills | Status: DC

## 2023-04-29 NOTE — Progress Notes (Signed)
Subjective:    Patient ID: Jacqueline Lowery, female    DOB: May 24, 1981, 42 y.o.   MRN: [id number]  Chief Complaint  Patient presents with   Medical Management of Chronic Issues   PT presents to the office today for CPE without pap.  Continues to have intermittent hives when she gets sweaty or in hay. She reports the Vistaril as needed helps.    She has been taking phentermine, but has been out for the last two months.      04/29/2023   10:39 AM 12/14/2022    8:47 AM 11/12/2022    9:41 AM  Last 3 Weights  Weight (lbs) 267 lb 9.6 oz 271 lb 269 lb 6.4 oz  Weight (kg) 121.383 kg 122.925 kg 122.199 kg    Pt requesting referral to dermatologist for skin check.   Hypertension This is a chronic problem. The current episode started more than 1 year ago. The problem has been resolved since onset. The problem is controlled. Associated symptoms include anxiety. Pertinent negatives include no malaise/fatigue, peripheral edema or shortness of breath. Risk factors for coronary artery disease include dyslipidemia and obesity. The current treatment provides moderate improvement.  Asthma She complains of cough and wheezing. There is no shortness of breath. This is a chronic problem. The current episode started more than 1 year ago. The problem occurs rarely. Associated symptoms include heartburn. Pertinent negatives include no malaise/fatigue. Her symptoms are alleviated by rest. She reports moderate improvement on treatment. Her symptoms are not alleviated by rest and leukotriene antagonist. Her past medical history is significant for asthma.  Anxiety Presents for follow-up visit. Symptoms include depressed mood, excessive worry, nervous/anxious behavior and restlessness. Patient reports no shortness of breath. Symptoms occur occasionally. The severity of symptoms is mild.   Her past medical history is significant for asthma.  Hip Pain  The incident occurred more than 1 week ago. There was no injury  mechanism. The pain is present in the right hip. The quality of the pain is described as aching. The pain is at a severity of 6/10. The pain has been Intermittent since onset. She reports no foreign bodies present. She has tried NSAIDs for the symptoms. The treatment provided moderate relief.  Gastroesophageal Reflux She complains of belching, coughing, heartburn and wheezing. This is a chronic problem. The current episode started more than 1 year ago. The problem occurs occasionally. Risk factors include obesity. She has tried a PPI for the symptoms. The treatment provided moderate relief.      Review of Systems  Constitutional:  Negative for malaise/fatigue.  Respiratory:  Positive for cough and wheezing. Negative for shortness of breath.   Gastrointestinal:  Positive for heartburn.  Psychiatric/Behavioral:  The patient is nervous/anxious.   All other systems reviewed and are negative.      Objective:   Physical Exam Vitals reviewed.  Constitutional:      General: She is not in acute distress.    Appearance: She is well-developed. She is obese.  HENT:     Head: Normocephalic and atraumatic.     Right Ear: Tympanic membrane normal.     Left Ear: Tympanic membrane normal.  Eyes:     Pupils: Pupils are equal, round, and reactive to light.  Neck:     Thyroid: No thyromegaly.  Cardiovascular:     Rate and Rhythm: Normal rate and regular rhythm.     Heart sounds: Normal heart sounds. No murmur heard. Pulmonary:     Effort: Pulmonary  effort is normal. No respiratory distress.     Breath sounds: Normal breath sounds. No wheezing.  Abdominal:     General: Bowel sounds are normal. There is no distension.     Palpations: Abdomen is soft.     Tenderness: There is no abdominal tenderness.  Musculoskeletal:        General: No tenderness. Normal range of motion.     Cervical back: Normal range of motion and neck supple.  Skin:    General: Skin is warm and dry.  Neurological:      Mental Status: She is alert and oriented to person, place, and time.     Cranial Nerves: No cranial nerve deficit.     Deep Tendon Reflexes: Reflexes are normal and symmetric.  Psychiatric:        Behavior: Behavior normal.        Thought Content: Thought content normal.        Judgment: Judgment normal.       BP 129/89   Pulse 85   Temp 98.7 F (37.1 C) (Temporal)   Ht 5\' 4"  (1.626 m)   Wt 267 lb 9.6 oz (121.4 kg)   SpO2 97%   BMI 45.93 kg/m      Assessment & Plan:  Jacqueline Lowery comes in today with chief complaint of Medical Management of Chronic Issues   Diagnosis and orders addressed:  1. Sciatica of right side - diclofenac (VOLTAREN) 75 MG EC tablet; Take 1 tablet (75 mg total) by mouth 2 (two) times daily.  Dispense: 30 tablet; Refill: 0 - CBC with Differential/Platelet - CMP14+EGFR  2. GAD (generalized anxiety disorder) - escitalopram (LEXAPRO) 5 MG tablet; Take 1 tablet (5 mg total) by mouth daily.  Dispense: 90 tablet; Refill: 0 - CBC with Differential/Platelet - CMP14+EGFR  3. Gastroesophageal reflux disease, unspecified whether esophagitis present - omeprazole (PRILOSEC) 20 MG capsule; TAKE 1 CAPSULE BY MOUTH EVERY DAY  Dispense: 90 capsule; Refill: 1 - CBC with Differential/Platelet - CMP14+EGFR  4. Severe obesity (BMI >= 40) (HCC) - phentermine (ADIPEX-P) 37.5 MG tablet; Take 1 tablet (37.5 mg total) by mouth daily before breakfast.  Dispense: 30 tablet; Refill: 2 - CBC with Differential/Platelet - CMP14+EGFR  5. Weight loss counseling, encounter for - phentermine (ADIPEX-P) 37.5 MG tablet; Take 1 tablet (37.5 mg total) by mouth daily before breakfast.  Dispense: 30 tablet; Refill: 2 - CBC with Differential/Platelet - CMP14+EGFR  6. Tachycardia - propranolol (INDERAL) 10 MG tablet; TAKE (1) TABLET 3 TIMES A DAY AS NEEDED.  Dispense: 90 tablet; Refill: 0 - CBC with Differential/Platelet - CMP14+EGFR  7. Annual physical exam - CBC with  Differential/Platelet - Lipid panel - CMP14+EGFR - TSH  8. Mild persistent asthma with acute exacerbation - CBC with Differential/Platelet - CMP14+EGFR  9. Essential hypertension - CBC with Differential/Platelet - CMP14+EGFR - losartan-hydrochlorothiazide (HYZAAR) 50-12.5 MG tablet; Take 1 tablet by mouth daily.  Dispense: 90 tablet; Refill: 1  10. Allergic rhinitis due to pollen, unspecified seasonality - CBC with Differential/Platelet - CMP14+EGFR   Labs pending Health Maintenance reviewed Diet and exercise encouraged  Follow up plan: 3 months    Jannifer Rodney, FNP

## 2023-04-29 NOTE — Telephone Encounter (Signed)
NA can not get through automated system

## 2023-04-29 NOTE — Patient Instructions (Signed)

## 2023-04-30 LAB — CBC WITH DIFFERENTIAL/PLATELET
Basophils Absolute: 0.1 10*3/uL (ref 0.0–0.2)
Basos: 1 %
EOS (ABSOLUTE): 0.5 10*3/uL — ABNORMAL HIGH (ref 0.0–0.4)
Eos: 6 %
Hematocrit: 40.9 % (ref 34.0–46.6)
Hemoglobin: 13.8 g/dL (ref 11.1–15.9)
Immature Grans (Abs): 0 10*3/uL (ref 0.0–0.1)
Immature Granulocytes: 0 %
Lymphocytes Absolute: 1.7 10*3/uL (ref 0.7–3.1)
Lymphs: 20 %
MCH: 29.6 pg (ref 26.6–33.0)
MCHC: 33.7 g/dL (ref 31.5–35.7)
MCV: 88 fL (ref 79–97)
Monocytes Absolute: 0.7 10*3/uL (ref 0.1–0.9)
Monocytes: 8 %
Neutrophils Absolute: 5.5 10*3/uL (ref 1.4–7.0)
Neutrophils: 65 %
Platelets: 317 10*3/uL (ref 150–450)
RBC: 4.67 x10E6/uL (ref 3.77–5.28)
RDW: 13.1 % (ref 11.7–15.4)
WBC: 8.3 10*3/uL (ref 3.4–10.8)

## 2023-04-30 LAB — CMP14+EGFR
ALT: 18 IU/L (ref 0–32)
AST: 19 IU/L (ref 0–40)
Albumin: 3.7 g/dL — ABNORMAL LOW (ref 3.9–4.9)
Alkaline Phosphatase: 71 IU/L (ref 44–121)
BUN/Creatinine Ratio: 16 (ref 9–23)
BUN: 12 mg/dL (ref 6–20)
Bilirubin Total: 0.4 mg/dL (ref 0.0–1.2)
CO2: 24 mmol/L (ref 20–29)
Calcium: 9.6 mg/dL (ref 8.7–10.2)
Chloride: 102 mmol/L (ref 96–106)
Creatinine, Ser: 0.73 mg/dL (ref 0.57–1.00)
Globulin, Total: 2.9 g/dL (ref 1.5–4.5)
Glucose: 92 mg/dL (ref 70–99)
Potassium: 4.3 mmol/L (ref 3.5–5.2)
Sodium: 141 mmol/L (ref 134–144)
Total Protein: 6.6 g/dL (ref 6.0–8.5)
eGFR: 107 mL/min/{1.73_m2} (ref >59)

## 2023-04-30 LAB — LIPID PANEL
Chol/HDL Ratio: 3.3 ratio (ref 0.0–4.4)
Cholesterol, Total: 158 mg/dL (ref 100–199)
HDL: 48 mg/dL (ref >39)
LDL Chol Calc (NIH): 89 mg/dL (ref 0–99)
Triglycerides: 114 mg/dL (ref 0–149)
VLDL Cholesterol Cal: 21 mg/dL (ref 5–40)

## 2023-04-30 LAB — TSH: TSH: 1.64 u[IU]/mL (ref 0.450–4.500)

## 2023-05-07 ENCOUNTER — Telehealth: Payer: Self-pay | Admitting: Family

## 2023-05-07 DIAGNOSIS — Z713 Dietary counseling and surveillance: Secondary | ICD-10-CM

## 2023-05-07 NOTE — Telephone Encounter (Signed)
Please review

## 2023-05-07 NOTE — Telephone Encounter (Signed)
Are you willing to change the pharmacies on this or do you want me to hold for [place]?

## 2023-05-07 NOTE — Telephone Encounter (Signed)
This isn't a life threatening med and is controlled so can wait for PCP return

## 2023-05-11 MED ORDER — PHENTERMINE HCL 37.5 MG PO TABS
37.5000 mg | ORAL_TABLET | Freq: Every day | ORAL | 2 refills | Status: DC
Start: 2023-05-11 — End: 2024-04-21

## 2023-05-11 NOTE — Telephone Encounter (Signed)
LMTCB Jacqueline Lowery

## 2023-05-11 NOTE — Telephone Encounter (Signed)
Prescription sent to pharmacy.

## 2023-05-22 ENCOUNTER — Other Ambulatory Visit: Payer: Self-pay | Admitting: Family

## 2023-05-22 DIAGNOSIS — R000 Tachycardia, unspecified: Secondary | ICD-10-CM

## 2023-06-07 MED ORDER — ONDANSETRON HCL 4 MG PO TABS: 4.0000 mg | ORAL_TABLET | Freq: Three times a day (TID) | ORAL | 0 refills | Status: DC | PRN

## 2023-07-15 ENCOUNTER — Other Ambulatory Visit: Payer: Self-pay | Admitting: Family

## 2023-07-15 DIAGNOSIS — Z1231 Encounter for screening mammogram for malignant neoplasm of breast: Secondary | ICD-10-CM

## 2023-07-16 ENCOUNTER — Other Ambulatory Visit: Payer: Self-pay | Admitting: Family

## 2023-07-16 MED ORDER — ONDANSETRON HCL 4 MG PO TABS: 4.0000 mg | ORAL_TABLET | Freq: Three times a day (TID) | ORAL | 2 refills | Status: DC | PRN

## 2023-07-26 ENCOUNTER — Other Ambulatory Visit: Payer: Self-pay | Admitting: Family

## 2023-07-26 DIAGNOSIS — F411 Generalized anxiety disorder: Secondary | ICD-10-CM

## 2023-07-28 ENCOUNTER — Inpatient Hospital Stay: Admission: RE | Admit: 2023-07-28 | Payer: BC Managed Care – PPO | Source: Ambulatory Visit

## 2023-07-28 DIAGNOSIS — G5601 Carpal tunnel syndrome, right upper limb: Secondary | ICD-10-CM | POA: Diagnosis not present

## 2023-08-16 ENCOUNTER — Ambulatory Visit
Admission: RE | Admit: 2023-08-16 | Discharge: 2023-08-16 | Disposition: A | Discharge status: Home / Self Care | Payer: BC Managed Care – PPO | Source: Ambulatory Visit | Attending: Family | Admitting: Family

## 2023-08-16 DIAGNOSIS — Z1231 Encounter for screening mammogram for malignant neoplasm of breast: Secondary | ICD-10-CM | POA: Diagnosis not present

## 2023-08-17 ENCOUNTER — Telehealth: Payer: Self-pay | Admitting: Family

## 2023-08-19 ENCOUNTER — Encounter: Payer: BC Managed Care – PPO | Admitting: Family

## 2023-08-19 ENCOUNTER — Other Ambulatory Visit: Payer: Self-pay | Admitting: Family

## 2023-08-19 DIAGNOSIS — R928 Other abnormal and inconclusive findings on diagnostic imaging of breast: Secondary | ICD-10-CM

## 2023-09-09 ENCOUNTER — Ambulatory Visit
Admission: RE | Admit: 2023-09-09 | Discharge: 2023-09-09 | Disposition: A | Discharge status: Home / Self Care | Payer: BC Managed Care – PPO | Source: Ambulatory Visit | Attending: Family | Admitting: Family

## 2023-09-09 DIAGNOSIS — R928 Other abnormal and inconclusive findings on diagnostic imaging of breast: Secondary | ICD-10-CM

## 2023-09-09 DIAGNOSIS — N6002 Solitary cyst of left breast: Secondary | ICD-10-CM | POA: Diagnosis not present

## 2023-09-14 ENCOUNTER — Encounter: Payer: BC Managed Care – PPO | Admitting: Family

## 2023-09-21 ENCOUNTER — Other Ambulatory Visit: Payer: Self-pay | Admitting: *Deleted

## 2023-09-21 MED ORDER — NORGESTIMATE-ETH ESTRADIOL 0.25-35 MG-MCG PO TABS: 1.0000 | ORAL_TABLET | Freq: Every day | ORAL | 2 refills | Status: DC

## 2023-10-25 ENCOUNTER — Other Ambulatory Visit: Payer: Self-pay | Admitting: Family

## 2023-10-25 DIAGNOSIS — I10 Essential (primary) hypertension: Secondary | ICD-10-CM

## 2023-10-25 DIAGNOSIS — K219 Gastro-esophageal reflux disease without esophagitis: Secondary | ICD-10-CM

## 2023-10-25 DIAGNOSIS — F411 Generalized anxiety disorder: Secondary | ICD-10-CM

## 2024-01-25 ENCOUNTER — Other Ambulatory Visit: Payer: Self-pay | Admitting: Family

## 2024-01-25 DIAGNOSIS — F411 Generalized anxiety disorder: Secondary | ICD-10-CM

## 2024-02-22 ENCOUNTER — Encounter: Admitting: Family

## 2024-02-24 ENCOUNTER — Other Ambulatory Visit: Payer: Self-pay | Admitting: Family

## 2024-02-24 DIAGNOSIS — F411 Generalized anxiety disorder: Secondary | ICD-10-CM

## 2024-04-06 ENCOUNTER — Other Ambulatory Visit: Payer: Self-pay | Admitting: Family

## 2024-04-21 ENCOUNTER — Ambulatory Visit: Admitting: Family

## 2024-04-21 ENCOUNTER — Encounter: Payer: Self-pay | Admitting: Family

## 2024-04-21 ENCOUNTER — Other Ambulatory Visit (HOSPITAL_COMMUNITY)
Admission: RE | Admit: 2024-04-21 | Discharge: 2024-04-21 | Disposition: A | Discharge status: Home / Self Care | Source: Ambulatory Visit | Attending: Family | Admitting: Family

## 2024-04-21 VITALS — BP 125/79 | HR 83 | Temp 97.8°F | Ht 64.0 in | Wt 179.8 lb

## 2024-04-21 DIAGNOSIS — Z01419 Encounter for gynecological examination (general) (routine) without abnormal findings: Secondary | ICD-10-CM

## 2024-04-21 DIAGNOSIS — Z0000 Encounter for general adult medical examination without abnormal findings: Secondary | ICD-10-CM

## 2024-04-21 DIAGNOSIS — F411 Generalized anxiety disorder: Secondary | ICD-10-CM

## 2024-04-21 DIAGNOSIS — Z713 Dietary counseling and surveillance: Secondary | ICD-10-CM

## 2024-04-21 DIAGNOSIS — I10 Essential (primary) hypertension: Secondary | ICD-10-CM

## 2024-04-21 DIAGNOSIS — Z0001 Encounter for general adult medical examination with abnormal findings: Secondary | ICD-10-CM | POA: Diagnosis not present

## 2024-04-21 DIAGNOSIS — E66811 Obesity, class 1: Secondary | ICD-10-CM

## 2024-04-21 DIAGNOSIS — M5431 Sciatica, right side: Secondary | ICD-10-CM | POA: Diagnosis not present

## 2024-04-21 DIAGNOSIS — K219 Gastro-esophageal reflux disease without esophagitis: Secondary | ICD-10-CM

## 2024-04-21 DIAGNOSIS — J4531 Mild persistent asthma with (acute) exacerbation: Secondary | ICD-10-CM

## 2024-04-21 DIAGNOSIS — J301 Allergic rhinitis due to pollen: Secondary | ICD-10-CM

## 2024-04-21 DIAGNOSIS — Z6830 Body mass index (BMI) 30.0-30.9, adult: Secondary | ICD-10-CM

## 2024-04-21 DIAGNOSIS — R000 Tachycardia, unspecified: Secondary | ICD-10-CM

## 2024-04-21 MED ORDER — ALBUTEROL SULFATE (2.5 MG/3ML) 0.083% IN NEBU: INHALATION_SOLUTION | RESPIRATORY_TRACT | 1 refills | Status: AC

## 2024-04-21 MED ORDER — ONDANSETRON HCL 4 MG PO TABS: 4.0000 mg | ORAL_TABLET | Freq: Three times a day (TID) | ORAL | 2 refills | Status: AC | PRN

## 2024-04-21 MED ORDER — MONTELUKAST SODIUM 10 MG PO TABS: 10.0000 mg | ORAL_TABLET | Freq: Every day | ORAL | 3 refills | Status: AC

## 2024-04-21 MED ORDER — FLUTICASONE PROPIONATE 50 MCG/ACT NA SUSP: NASAL | 5 refills | Status: AC

## 2024-04-21 MED ORDER — ESCITALOPRAM OXALATE 5 MG PO TABS: 5.0000 mg | ORAL_TABLET | Freq: Every day | ORAL | 3 refills | Status: AC

## 2024-04-21 MED ORDER — OMEPRAZOLE 20 MG PO CPDR: 20.0000 mg | DELAYED_RELEASE_CAPSULE | Freq: Every day | ORAL | 4 refills | Status: AC

## 2024-04-21 MED ORDER — PHENTERMINE HCL 37.5 MG PO TABS: 37.5000 mg | ORAL_TABLET | Freq: Every day | ORAL | 2 refills | Status: AC

## 2024-04-21 MED ORDER — PROPRANOLOL HCL 10 MG PO TABS: ORAL_TABLET | ORAL | 2 refills | Status: AC

## 2024-04-21 MED ORDER — ALBUTEROL SULFATE HFA 108 (90 BASE) MCG/ACT IN AERS: INHALATION_SPRAY | RESPIRATORY_TRACT | 1 refills | Status: AC

## 2024-04-21 MED ORDER — NORGESTIMATE-ETH ESTRADIOL 0.25-35 MG-MCG PO TABS: 1.0000 | ORAL_TABLET | Freq: Every day | ORAL | 3 refills | Status: AC

## 2024-04-21 MED ORDER — TRIAMCINOLONE ACETONIDE 0.5 % EX OINT: 1.0000 | TOPICAL_OINTMENT | Freq: Two times a day (BID) | CUTANEOUS | 1 refills | Status: AC

## 2024-04-21 MED ORDER — LOSARTAN POTASSIUM-HCTZ 50-12.5 MG PO TABS: 1.0000 | ORAL_TABLET | Freq: Every day | ORAL | 1 refills | Status: AC

## 2024-04-21 NOTE — Progress Notes (Signed)
 Subjective:    Patient ID: Jacqueline Lowery, female    DOB: [birth year], 43 y.o.   MRN: [id number]  Chief Complaint  Patient presents with   Annual Exam   PT presents to the office today for CPE with pap.    Continues to have intermittent hives when she gets sweaty or in hay. She reports the Vistaril as needed helps.    She is currently taking Zepbound. She has lost 88 lbs.     04/21/2024   10:59 AM 04/29/2023   10:39 AM 12/14/2022    8:47 AM  Last 3 Weights  Weight (lbs) 179 lb 12.8 oz 267 lb 9.6 oz 271 lb  Weight (kg) 81.557 kg 121.383 kg 122.925 kg    Pt requesting referral to dermatologist for skin check.   Hypertension This is a chronic problem. The current episode started more than 1 year ago. The problem has been resolved since onset. The problem is controlled. Associated symptoms include anxiety. Pertinent negatives include no malaise/fatigue, peripheral edema or shortness of breath. Risk factors for coronary artery disease include dyslipidemia and obesity. The current treatment provides moderate improvement.  Asthma There is no cough, shortness of breath or wheezing. This is a chronic problem. The current episode started more than 1 year ago. The problem occurs rarely. Associated symptoms include heartburn. Pertinent negatives include no malaise/fatigue. Her symptoms are alleviated by rest. She reports moderate improvement on treatment. Her symptoms are not alleviated by rest and leukotriene antagonist. Her past medical history is significant for asthma.  Anxiety Presents for follow-up visit. Symptoms include depressed mood, excessive worry, nervous/anxious behavior and restlessness. Patient reports no shortness of breath. Symptoms occur most days. The severity of symptoms is moderate.   Her past medical history is significant for asthma.  Gastroesophageal Reflux She complains of belching and heartburn. She reports no coughing or no wheezing. This is a chronic problem. The  current episode started more than 1 year ago. The problem occurs occasionally. The symptoms are aggravated by certain foods. Risk factors include obesity. She has tried a PPI for the symptoms. The treatment provided moderate relief.      Review of Systems  Constitutional:  Negative for malaise/fatigue.  Respiratory:  Negative for cough, shortness of breath and wheezing.   Gastrointestinal:  Positive for heartburn.  Psychiatric/Behavioral:  The patient is nervous/anxious.   All other systems reviewed and are negative.  Family History  Problem Relation Age of Onset   Hypertension Mother    Hypertension Father    Breast cancer Maternal Grandmother    Heart disease Maternal Grandmother    Breast cancer Paternal Grandmother    Social History   Socioeconomic History   Marital status: Married    Spouse name: Not on file   Number of children: Not on file   Years of education: Not on file   Highest education level: Associate degree: occupational, Scientist, product/process development, or vocational program  Occupational History   Not on file  Tobacco Use   Smoking status: Never   Smokeless tobacco: Never  Vaping Use   Vaping status: Never Used  Substance and Sexual Activity   Alcohol use: No   Drug use: No   Sexual activity: Not on file  Other Topics Concern   Not on file  Social History Narrative   Not on file   Social Drivers of Health   Financial Resource Strain: Low Risk  (12/14/2022)   Overall Financial Resource Strain (CARDIA)    Difficulty of Paying  Living Expenses: Not hard at all  Food Insecurity: No Food Insecurity (12/14/2022)   Hunger Vital Sign    Worried About Running Out of Food in the Last Year: Never true    Ran Out of Food in the Last Year: Never true  Transportation Needs: No Transportation Needs (12/14/2022)   PRAPARE - Administrator, Civil Service (Medical): No    Lack of Transportation (Non-Medical): No  Physical Activity: Insufficiently Active (12/14/2022)   Exercise  Vital Sign    Days of Exercise per Week: 3 days    Minutes of Exercise per Session: 30 min  Stress: Patient Declined (12/14/2022)   [employer] of Occupational Health - Occupational Stress Questionnaire    Feeling of Stress : Patient declined  Social Connections: Unknown (12/14/2022)   Social Connection and Isolation Panel    Frequency of Communication with Friends and Family: More than three times a week    Frequency of Social Gatherings with Friends and Family: More than three times a week    Attends Religious Services: More than 4 times per year    Active Member of [employer] or Organizations: Patient declined    Attends Banker Meetings: Not on file    Marital Status: Not on file        Objective:   Physical Exam Vitals reviewed.  Constitutional:      General: She is not in acute distress.    Appearance: She is well-developed. She is obese.  HENT:     Head: Normocephalic and atraumatic.     Right Ear: Tympanic membrane normal.     Left Ear: Tympanic membrane normal.  Eyes:     Pupils: Pupils are equal, round, and reactive to light.  Neck:     Thyroid: No thyromegaly.  Cardiovascular:     Rate and Rhythm: Normal rate and regular rhythm.     Heart sounds: Normal heart sounds. No murmur heard. Pulmonary:     Effort: Pulmonary effort is normal. No respiratory distress.     Breath sounds: Normal breath sounds. No wheezing.  Chest:  Breasts:    Right: No swelling, bleeding, inverted nipple, mass, nipple discharge, skin change or tenderness.     Left: No swelling, bleeding, inverted nipple, mass, nipple discharge, skin change or tenderness.  Abdominal:     General: Bowel sounds are normal. There is no distension.     Palpations: Abdomen is soft.     Tenderness: There is no abdominal tenderness.  Genitourinary:    Comments: Bimanual exam- no adnexal masses or tenderness, ovaries nonpalpable   Cervix parous and pink- No discharge  Musculoskeletal:         General: No tenderness. Normal range of motion.     Cervical back: Normal range of motion and neck supple.  Skin:    General: Skin is warm and dry.  Neurological:     Mental Status: She is alert and oriented to person, place, and time.     Cranial Nerves: No cranial nerve deficit.     Deep Tendon Reflexes: Reflexes are normal and symmetric.  Psychiatric:        Behavior: Behavior normal.        Thought Content: Thought content normal.        Judgment: Judgment normal.       BP 125/79   Pulse 83   Temp 97.8 F (36.6 C) (Temporal)   Ht 5' 4 (1.626 m)   Wt 179 lb 12.8 oz (  81.6 kg)   SpO2 95%   BMI 30.86 kg/m      Assessment & Plan:  ZIYAH CORDOBA comes in today with chief complaint of Annual Exam   Diagnosis and orders addressed:  1. Mild persistent asthma with acute exacerbation  - albuterol (PROVENTIL) (2.5 MG/3ML) 0.083% nebulizer solution; TAKE NEBULIZATION EVERY 6 HOURS AS NEEDED FOR WHEEZING OR SHORTNESS OF BREATH  Dispense: 150 mL; Refill: 1 - albuterol (VENTOLIN HFA) 108 (90 Base) MCG/ACT inhaler; INHALE 2 PUFFS EVERY 6 HOURS AS NEEDED FOR WHEEZING  Dispense: 8.5 g; Refill: 1 - fluticasone (FLONASE) 50 MCG/ACT nasal spray; USE 2 SPRAYS IN EACH NOSTRIL ONCE DAILY.  Dispense: 16 g; Refill: 5 - montelukast (SINGULAIR) 10 MG tablet; Take 1 tablet (10 mg total) by mouth at bedtime.  Dispense: 90 tablet; Refill: 3 - CMP14+EGFR - CBC with Differential/Platelet  2. Sciatica of right side - CMP14+EGFR - CBC with Differential/Platelet  3. GAD (generalized anxiety disorder)  - escitalopram (LEXAPRO) 5 MG tablet; Take 1 tablet (5 mg total) by mouth daily.  Dispense: 90 tablet; Refill: 3 - CMP14+EGFR - CBC with Differential/Platelet  4. Essential hypertension - losartan-hydrochlorothiazide (HYZAAR) 50-12.5 MG tablet; Take 1 tablet by mouth daily.  Dispense: 90 tablet; Refill: 1 - CMP14+EGFR - CBC with Differential/Platelet - TSH  5. Gastroesophageal reflux  disease, unspecified whether esophagitis present - omeprazole (PRILOSEC) 20 MG capsule; Take 1 capsule (20 mg total) by mouth daily.  Dispense: 90 capsule; Refill: 4 - CMP14+EGFR - CBC with Differential/Platelet  6. Tachycardia - propranolol (INDERAL) 10 MG tablet; TAKE (1) TABLET 3 TIMES A DAY AS NEEDED.  Dispense: 270 tablet; Refill: 2 - CMP14+EGFR - CBC with Differential/Platelet  7. Class 1 obesity without serious comorbidity with body mass index (BMI) of 30.0 to 30.9 in adult, unspecified obesity type - CMP14+EGFR - CBC with Differential/Platelet  8. Allergic rhinitis due to pollen, unspecified seasonality - CMP14+EGFR - CBC with Differential/Platelet  9. Annual physical exam (Primary) - CMP14+EGFR - CBC with Differential/Platelet - Lipid panel - TSH - Cytology - PAP([place])  10. Weight loss counseling, encounter for - phentermine (ADIPEX-P) 37.5 MG tablet; Take 1 tablet (37.5 mg total) by mouth daily before breakfast.  Dispense: 30 tablet; Refill: 2 - CMP14+EGFR - CBC with Differential/Platelet  11. Encounter for gynecological examination without abnormal finding - Cytology - PAP([place])   Labs pending Keep up the great work on your weight loss! Encourage exercise  Continue current medications  Health Maintenance reviewed Diet and exercise encouraged  Follow up plan: 6 months    Bari Learn, FNP

## 2024-04-21 NOTE — Patient Instructions (Signed)

## 2024-04-22 LAB — CBC WITH DIFFERENTIAL/PLATELET
Basophils Absolute: 0.1 x10E3/uL (ref 0.0–0.2)
Basos: 1 %
EOS (ABSOLUTE): 0.3 x10E3/uL (ref 0.0–0.4)
Eos: 5 %
Hematocrit: 45 % (ref 34.0–46.6)
Hemoglobin: 14.6 g/dL (ref 11.1–15.9)
Immature Grans (Abs): 0 x10E3/uL (ref 0.0–0.1)
Immature Granulocytes: 0 %
Lymphocytes Absolute: 1.6 x10E3/uL (ref 0.7–3.1)
Lymphs: 31 %
MCH: 30 pg (ref 26.6–33.0)
MCHC: 32.4 g/dL (ref 31.5–35.7)
MCV: 93 fL (ref 79–97)
Monocytes Absolute: 0.5 x10E3/uL (ref 0.1–0.9)
Monocytes: 9 %
Neutrophils Absolute: 2.9 x10E3/uL (ref 1.4–7.0)
Neutrophils: 54 %
Platelets: 344 x10E3/uL (ref 150–450)
RBC: 4.86 x10E6/uL (ref 3.77–5.28)
RDW: 12.4 % (ref 11.7–15.4)
WBC: 5.4 x10E3/uL (ref 3.4–10.8)

## 2024-04-22 LAB — LIPID PANEL
Chol/HDL Ratio: 2.9 ratio (ref 0.0–4.4)
Cholesterol, Total: 149 mg/dL (ref 100–199)
HDL: 51 mg/dL (ref >39)
LDL Chol Calc (NIH): 87 mg/dL (ref 0–99)
Triglycerides: 52 mg/dL (ref 0–149)
VLDL Cholesterol Cal: 11 mg/dL (ref 5–40)

## 2024-04-22 LAB — CMP14+EGFR
ALT: 21 IU/L (ref 0–32)
AST: 16 IU/L (ref 0–40)
Albumin: 4 g/dL (ref 3.9–4.9)
Alkaline Phosphatase: 65 IU/L (ref 44–121)
BUN/Creatinine Ratio: 18 (ref 9–23)
BUN: 13 mg/dL (ref 6–24)
Bilirubin Total: 0.6 mg/dL (ref 0.0–1.2)
CO2: 24 mmol/L (ref 20–29)
Calcium: 9.4 mg/dL (ref 8.7–10.2)
Chloride: 101 mmol/L (ref 96–106)
Creatinine, Ser: 0.74 mg/dL (ref 0.57–1.00)
Globulin, Total: 2.6 g/dL (ref 1.5–4.5)
Glucose: 82 mg/dL (ref 70–99)
Potassium: 3.9 mmol/L (ref 3.5–5.2)
Sodium: 137 mmol/L (ref 134–144)
Total Protein: 6.6 g/dL (ref 6.0–8.5)
eGFR: 105 mL/min/1.73 (ref >59)

## 2024-04-22 LAB — TSH: TSH: 1.8 u[IU]/mL (ref 0.450–4.500)

## 2024-04-24 ENCOUNTER — Ambulatory Visit: Payer: Self-pay | Admitting: Family

## 2024-04-26 LAB — CYTOLOGY - PAP
Diagnosis: NEGATIVE
Diagnosis: REACTIVE

## 2025-04-23 ENCOUNTER — Encounter: Payer: Self-pay | Admitting: Family
